# Patient Record
Sex: Male | Born: 1990 | Race: White | Hispanic: No | Marital: Single | Smoking: Former smoker
Health system: Southern US, Community
[De-identification: ages and names within clinical notes are randomized; demographics above are authoritative.]

## PROBLEM LIST (undated history)

## (undated) DIAGNOSIS — K2970 Gastritis, unspecified, without bleeding: Secondary | ICD-10-CM

## (undated) DIAGNOSIS — K922 Gastrointestinal hemorrhage, unspecified: Secondary | ICD-10-CM

## (undated) HISTORY — PX: OTHER SURGICAL HISTORY: SHX169

## (undated) HISTORY — PX: NO PAST SURGERIES: SHX2092

---

## 2015-11-15 ENCOUNTER — Encounter: Payer: Self-pay | Admitting: Emergency Medicine

## 2015-11-15 DIAGNOSIS — K922 Gastrointestinal hemorrhage, unspecified: Secondary | ICD-10-CM | POA: Diagnosis present

## 2015-11-15 DIAGNOSIS — Z87891 Personal history of nicotine dependence: Secondary | ICD-10-CM | POA: Insufficient documentation

## 2015-11-15 DIAGNOSIS — K529 Noninfective gastroenteritis and colitis, unspecified: Principal | ICD-10-CM | POA: Insufficient documentation

## 2015-11-15 DIAGNOSIS — R1032 Left lower quadrant pain: Secondary | ICD-10-CM | POA: Diagnosis not present

## 2015-11-15 LAB — URINALYSIS COMPLETE WITH MICROSCOPIC (ARMC ONLY)
BILIRUBIN URINE: NEGATIVE
Glucose, UA: NEGATIVE mg/dL
Hgb urine dipstick: NEGATIVE
Ketones, ur: NEGATIVE mg/dL
Leukocytes, UA: NEGATIVE
Nitrite: NEGATIVE
PH: 5 (ref 5.0–8.0)
PROTEIN: NEGATIVE mg/dL
Specific Gravity, Urine: 1.03 (ref 1.005–1.030)

## 2015-11-15 LAB — COMPREHENSIVE METABOLIC PANEL
ALK PHOS: 54 U/L (ref 38–126)
ALT: 19 U/L (ref 17–63)
ANION GAP: 8 (ref 5–15)
AST: 22 U/L (ref 15–41)
Albumin: 5.1 g/dL — ABNORMAL HIGH (ref 3.5–5.0)
BILIRUBIN TOTAL: 0.5 mg/dL (ref 0.3–1.2)
BUN: 24 mg/dL — ABNORMAL HIGH (ref 6–20)
CALCIUM: 10 mg/dL (ref 8.9–10.3)
CO2: 29 mmol/L (ref 22–32)
CREATININE: 1.1 mg/dL (ref 0.61–1.24)
Chloride: 100 mmol/L — ABNORMAL LOW (ref 101–111)
Glucose, Bld: 109 mg/dL — ABNORMAL HIGH (ref 65–99)
Potassium: 4.6 mmol/L (ref 3.5–5.1)
Sodium: 137 mmol/L (ref 135–145)
TOTAL PROTEIN: 7.8 g/dL (ref 6.5–8.1)

## 2015-11-15 LAB — CBC
HCT: 43.1 % (ref 40.0–52.0)
HEMOGLOBIN: 14.9 g/dL (ref 13.0–18.0)
MCH: 30.4 pg (ref 26.0–34.0)
MCHC: 34.7 g/dL (ref 32.0–36.0)
MCV: 87.8 fL (ref 80.0–100.0)
PLATELETS: 177 10*3/uL (ref 150–440)
RBC: 4.91 MIL/uL (ref 4.40–5.90)
RDW: 12.1 % (ref 11.5–14.5)
WBC: 12.3 10*3/uL — ABNORMAL HIGH (ref 3.8–10.6)

## 2015-11-15 LAB — LIPASE, BLOOD: Lipase: 23 U/L (ref 11–51)

## 2015-11-15 NOTE — ED Notes (Signed)
Pt states has had abdominal pain pain today, left upper and left lower quadrant with diarrhea today. Pt states has had "blood in the diarrhea". Pt states has vomited once since onset of abd pain. Pt states has had chills.

## 2015-11-16 ENCOUNTER — Observation Stay
Admission: EM | Admit: 2015-11-16 | Discharge: 2015-11-17 | Disposition: A | Discharge status: Home / Self Care | Payer: BLUE CROSS/BLUE SHIELD | Attending: Internal Medicine | Admitting: Internal Medicine

## 2015-11-16 ENCOUNTER — Emergency Department: Payer: BLUE CROSS/BLUE SHIELD

## 2015-11-16 DIAGNOSIS — K922 Gastrointestinal hemorrhage, unspecified: Secondary | ICD-10-CM

## 2015-11-16 DIAGNOSIS — R109 Unspecified abdominal pain: Secondary | ICD-10-CM | POA: Diagnosis present

## 2015-11-16 DIAGNOSIS — K529 Noninfective gastroenteritis and colitis, unspecified: Secondary | ICD-10-CM

## 2015-11-16 HISTORY — DX: Gastritis, unspecified, without bleeding: K29.70

## 2015-11-16 LAB — HEMOGLOBIN AND HEMATOCRIT, BLOOD
HEMATOCRIT: 37.8 % — AB (ref 40.0–52.0)
HEMOGLOBIN: 13.4 g/dL (ref 13.0–18.0)

## 2015-11-16 MED ORDER — SODIUM CHLORIDE 0.9 % IV SOLN
INTRAVENOUS | Status: DC
  Administered 2015-11-16: 10:00:00 via INTRAVENOUS

## 2015-11-16 MED ORDER — MORPHINE SULFATE (PF) 2 MG/ML IV SOLN
2.0000 mg | INTRAVENOUS | Status: DC | PRN
  Administered 2015-11-16 (×4): 2 mg via INTRAVENOUS
  Filled 2015-11-16 (×4): qty 1

## 2015-11-16 MED ORDER — DIATRIZOATE MEGLUMINE & SODIUM 66-10 % PO SOLN
15.0000 mL | ORAL | Status: AC
  Administered 2015-11-16: 30 mL via ORAL
  Administered 2015-11-16: 15 mL via ORAL

## 2015-11-16 MED ORDER — ONDANSETRON HCL 4 MG PO TABS: 4.0000 mg | ORAL_TABLET | Freq: Four times a day (QID) | ORAL | Status: DC | PRN

## 2015-11-16 MED ORDER — ACETAMINOPHEN 650 MG RE SUPP: 650.0000 mg | Freq: Four times a day (QID) | RECTAL | Status: DC | PRN

## 2015-11-16 MED ORDER — OXYCODONE-ACETAMINOPHEN 5-325 MG PO TABS
1.0000 | ORAL_TABLET | Freq: Four times a day (QID) | ORAL | Status: DC | PRN
  Administered 2015-11-16 (×2): 1 via ORAL
  Filled 2015-11-16 (×2): qty 1

## 2015-11-16 MED ORDER — ACETAMINOPHEN 325 MG PO TABS: 650.0000 mg | ORAL_TABLET | Freq: Four times a day (QID) | ORAL | Status: DC | PRN

## 2015-11-16 MED ORDER — METRONIDAZOLE IN NACL 5-0.79 MG/ML-% IV SOLN
500.0000 mg | Freq: Three times a day (TID) | INTRAVENOUS | Status: DC
  Administered 2015-11-16 – 2015-11-17 (×4): 500 mg via INTRAVENOUS
  Filled 2015-11-16 (×6): qty 100

## 2015-11-16 MED ORDER — CIPROFLOXACIN IN D5W 400 MG/200ML IV SOLN
400.0000 mg | Freq: Two times a day (BID) | INTRAVENOUS | Status: DC
  Administered 2015-11-16 – 2015-11-17 (×3): 400 mg via INTRAVENOUS
  Filled 2015-11-16 (×4): qty 200

## 2015-11-16 MED ORDER — IOPAMIDOL (ISOVUE-300) INJECTION 61%
100.0000 mL | Freq: Once | INTRAVENOUS | Status: AC | PRN
  Administered 2015-11-16: 100 mL via INTRAVENOUS

## 2015-11-16 MED ORDER — ONDANSETRON HCL 4 MG/2ML IJ SOLN: 4.0000 mg | Freq: Four times a day (QID) | INTRAMUSCULAR | Status: DC | PRN

## 2015-11-16 MED ORDER — SODIUM CHLORIDE 0.9 % IV BOLUS (SEPSIS)
500.0000 mL | INTRAVENOUS | Status: AC
  Administered 2015-11-16: 500 mL via INTRAVENOUS

## 2015-11-16 NOTE — ED Notes (Signed)
Pt c/o of chills. Pt visibly trembling. Pt's oral temperature is 98.1

## 2015-11-16 NOTE — ED Notes (Signed)
Pt had BM, blood w/ clots present, about 10cc in hat.

## 2015-11-16 NOTE — H&P (Addendum)
PCP:   No primary care provider on file.   Chief Complaint:  Abdominal pain  HPI: This is a 25 year old gentleman who states that last night at approximately 9 PM he went to use the bathroom. While there he had the sudden onset of severe sharp abdominal pain. Pain is worse in the left lower quadrant but it is generalized pain. At its worst the pain is 10/10, currently pain is 10/10. His pain is described as sharp and intermittent. He reports some nausea but no vomiting. He denies any fever or chills. He has had several bowel movements all with blood. He has been having bright red blood per rectum, with some clots. He came to ER. He denies using the incidence. He denies GERD. He's never had a colonoscopy.  Review of Systems:  The patient denies anorexia, fever, weight loss,, vision loss, decreased hearing, hoarseness, chest pain, syncope, dyspnea on exertion, peripheral edema, balance deficits, hemoptysis, abdominal pain, bright red blood per rectum, melena, hematochezia, severe indigestion/heartburn, hematuria, incontinence, genital sores, muscle weakness, suspicious skin lesions, transient blindness, difficulty walking, depression, unusual weight change, abnormal bleeding, enlarged lymph nodes, angioedema, and breast masses.  Past Medical History: Past Medical History  Diagnosis Date  . Gastritis    History reviewed. No pertinent past surgical history.  Medications: Prior to Admission medications   Not on File    Allergies:  No Known Allergies  Social History:  reports that he has quit smoking. He has never used smokeless tobacco. He reports that he does not drink alcohol or use illicit drugs.  Family History: Diabetes mellitus  Physical Exam: Filed Vitals:   11/16/15 0300 11/16/15 0330 11/16/15 0400 11/16/15 0500  BP: 125/77 118/72 126/75 102/65  Pulse: 67 68 76   Temp:      TempSrc:      Resp: 18 17 19 17   Height:      Weight:      SpO2: 99% 96% 99%     General:   Alert and oriented times three, well developed and nourished, no acute distress Eyes: PERRLA, pink conjunctiva, no scleral icterus ENT: Moist oral mucosa, neck supple, no thyromegaly Lungs: clear to ascultation, no wheeze, no crackles, no use of accessory muscles Cardiovascular: regular rate and rhythm, no regurgitation, no gallops, no murmurs. No carotid bruits, no JVD Abdomen: soft, positive BS, Positive tenderness to palpation generalized, non-distended, no organomegaly, not an acute abdomen GU: not examined Neuro: CN II - XII grossly intact, sensation intact Musculoskeletal: strength 5/5 all extremities, no clubbing, cyanosis or edema Skin: no rash, no subcutaneous crepitation, no decubitus Psych: appropriate patient   Labs on Admission:   Recent Labs  11/15/15 2210  NA 137  K 4.6  CL 100*  CO2 29  GLUCOSE 109*  BUN 24*  CREATININE 1.10  CALCIUM 10.0    Recent Labs  11/15/15 2210  AST 22  ALT 19  ALKPHOS 54  BILITOT 0.5  PROT 7.8  ALBUMIN 5.1*    Recent Labs  11/15/15 2210  LIPASE 23    Recent Labs  11/15/15 2210  WBC 12.3*  HGB 14.9  HCT 43.1  MCV 87.8  PLT 177   No results for input(s): CKTOTAL, CKMB, CKMBINDEX, TROPONINI in the last 72 hours. Invalid input(s): POCBNP No results for input(s): DDIMER in the last 72 hours. No results for input(s): HGBA1C in the last 72 hours. No results for input(s): CHOL, HDL, LDLCALC, TRIG, CHOLHDL, LDLDIRECT in the last 72 hours. No results for input(s):  TSH, T4TOTAL, T3FREE, THYROIDAB in the last 72 hours.  Invalid input(s): FREET3 No results for input(s): VITAMINB12, FOLATE, FERRITIN, TIBC, IRON, RETICCTPCT in the last 72 hours.  Micro Results: No results found for this or any previous visit (from the past 240 hour(s)).   Radiological Exams on Admission: Ct Abdomen Pelvis W Contrast  11/16/2015  CLINICAL DATA:  25 year old male with left lower quadrant abdominal pain and melena. EXAM: CT ABDOMEN AND  PELVIS WITH CONTRAST TECHNIQUE: Multidetector CT imaging of the abdomen and pelvis was performed using the standard protocol following bolus administration of intravenous contrast. CONTRAST:  100mL ISOVUE-300 IOPAMIDOL (ISOVUE-300) INJECTION 61% COMPARISON:  None. FINDINGS: The visualized lung bases are clear. No intra-abdominal free air or free fluid. The liver, gallbladder, pancreas, spleen, adrenal glands, kidneys, visualized ureters appear unremarkable. The urinary bladder is collapsed. The prostate and seminal vesicles are grossly unremarkable. There is mild segmental thickening of the distal descending and sigmoid colon most compatible with colitis given clinical history. There is no evidence of bowel obstruction. Normal appendix. The abdominal aorta and IVC appear unremarkable. No portal venous gas identified. There is no adenopathy. The abdominal wall soft tissues appear unremarkable. No acute osseous pathology identified. IMPRESSION: Segmental thickening of the distal descending and sigmoid colon compatible with colitis. Clinical correlation is recommended. No bowel obstruction. Normal appendix. Electronically Signed   By: Elgie CollardArash  Radparvar M.D.   On: 11/16/2015 04:41    Assessment/Plan Present on Admission:  . Colitis Bright red blood per rectum -Admit to MedSurg -Nothing by mouth, IV fluid hydration -Cipro and Flagyl ordered -Morphine when necessary pain -GI consult per AM team in the morning -H&H every 8 hours   Kailie Polus 11/16/2015, 5:56 AM

## 2015-11-16 NOTE — ED Provider Notes (Signed)
[facility] Emergency Department Provider Note  ____________________________________________  Time seen: Approximately 2:30 AM  I have reviewed the triage vital signs and the nursing notes.   HISTORY  Chief Complaint Abdominal Pain and Rectal Bleeding    HPI Brandon Walsh is a 25 y.o. male with no significant past medical history who presents for acute onset of middle and left-sided abdominal pain starting a few hours ago.  He reports that he has had some dark blood in his stool multiple times and has had multiple loose stools since the pain began.  Her had similar symptoms in the past and he rates the pain as severe, sharp and stabbing, but now down to a mild ache.  He has no personal history nor any first-degree relatives with a history of ulcerative colitis or Crohn's disease.  He denies fever but has had subjective chills.  He denies chest pain, shortness of breath.  He did have an episode of vomiting but there is no blood in his emesis.  He has had no trauma recently.  He does not drink alcohol.He has been diagnosed previously with acid reflux and gastritis and he takes, or took in the past, ranitidine, but it did not significantly improve his symptoms.   Past Medical History  Diagnosis Date  . Gastritis     There are no active problems to display for this patient.   History reviewed. No pertinent past surgical history.  No current outpatient prescriptions on file.  Allergies Review of patient's allergies indicates no known allergies.  No family history on file.  Social History Social History  Substance Use Topics  . Smoking status: Former Games developermoker  . Smokeless tobacco: Never Used  . Alcohol Use: No    Review of Systems Constitutional: No fever/chills Eyes: No visual changes. ENT: No sore throat. Cardiovascular: Denies chest pain. Respiratory: Denies shortness of breath. Gastrointestinal: No abdominal pain.  No nausea, no vomiting.  No  diarrhea.  No constipation. Genitourinary: Negative for dysuria. Musculoskeletal: Negative for back pain. Skin: Negative for rash. Neurological: Negative for headaches, focal weakness or numbness.  10-point ROS otherwise negative.  ____________________________________________   PHYSICAL EXAM:  VITAL SIGNS: ED Triage Vitals  Enc Vitals Group     BP 11/15/15 2203 114/69 mmHg     Pulse Rate 11/15/15 2203 78     Resp 11/15/15 2203 16     Temp 11/15/15 2203 98.5 F (36.9 C)     Temp Source 11/15/15 2203 Oral     SpO2 11/15/15 2203 100 %     Weight 11/15/15 2203 190 lb (86.183 kg)     Height 11/15/15 2203 6' (1.829 m)     Head Cir --      Peak Flow --      Pain Score 11/15/15 2203 4     Pain Loc --      Pain Edu? --      Excl. in GC? --     Constitutional: Alert and oriented. Well appearing and in no acute distress. Eyes: Conjunctivae are normal. PERRL. EOMI. Head: Atraumatic. Nose: No congestion/rhinnorhea. Mouth/Throat: Mucous membranes are moist.  Oropharynx non-erythematous. Neck: No stridor.  No meningeal signs.   Cardiovascular: Normal rate, regular rhythm. Good peripheral circulation. Grossly normal heart sounds.   Respiratory: Normal respiratory effort.  No retractions. Lungs CTAB. Gastrointestinal: Soft With tenderness to palpation of his left upper quadrant and left side of his abdomen in general.  No rebound and no guarding.  No tenderness  to palpation of the right lower quadrant specifically with no tenderness at McBurney's point.  Negative Murphy sign. Rectal:  Normal external exam.  Maroon blood on digital exam, strongly heme positive.  No significant stool in rectum. Musculoskeletal: No lower extremity tenderness nor edema. No gross deformities of extremities. Neurologic:  Normal speech and language. No gross focal neurologic deficits are appreciated.  Skin:  Skin is warm, dry and intact. No rash noted. Psychiatric: Mood and affect are normal. Speech and  behavior are normal.  ____________________________________________   LABS (all labs ordered are listed, but only abnormal results are displayed)  Labs Reviewed  COMPREHENSIVE METABOLIC PANEL - Abnormal; Notable for the following:    Chloride 100 (*)    Glucose, Bld 109 (*)    BUN 24 (*)    Albumin 5.1 (*)    All other components within normal limits  CBC - Abnormal; Notable for the following:    WBC 12.3 (*)    All other components within normal limits  URINALYSIS COMPLETEWITH MICROSCOPIC ([facility] ONLY) - Abnormal; Notable for the following:    Color, Urine YELLOW (*)    APPearance CLEAR (*)    Bacteria, UA RARE (*)    Squamous Epithelial / LPF 0-5 (*)    All other components within normal limits  LIPASE, BLOOD   ____________________________________________  EKG  None ____________________________________________  RADIOLOGY   Ct Abdomen Pelvis W Contrast  11/16/2015  CLINICAL DATA:  25 year old male with left lower quadrant abdominal pain and melena. EXAM: CT ABDOMEN AND PELVIS WITH CONTRAST TECHNIQUE: Multidetector CT imaging of the abdomen and pelvis was performed using the standard protocol following bolus administration of intravenous contrast. CONTRAST:  100mL ISOVUE-300 IOPAMIDOL (ISOVUE-300) INJECTION 61% COMPARISON:  None. FINDINGS: The visualized lung bases are clear. No intra-abdominal free air or free fluid. The liver, gallbladder, pancreas, spleen, adrenal glands, kidneys, visualized ureters appear unremarkable. The urinary bladder is collapsed. The prostate and seminal vesicles are grossly unremarkable. There is mild segmental thickening of the distal descending and sigmoid colon most compatible with colitis given clinical history. There is no evidence of bowel obstruction. Normal appendix. The abdominal aorta and IVC appear unremarkable. No portal venous gas identified. There is no adenopathy. The abdominal wall soft tissues appear unremarkable. No acute osseous  pathology identified. IMPRESSION: Segmental thickening of the distal descending and sigmoid colon compatible with colitis. Clinical correlation is recommended. No bowel obstruction. Normal appendix. Electronically Signed   By: Elgie CollardArash  Radparvar M.D.   On: 11/16/2015 04:41    ____________________________________________   PROCEDURES  Procedure(s) performed:   Procedures   ____________________________________________   INITIAL IMPRESSION / ASSESSMENT AND PLAN / ED COURSE  Pertinent labs & imaging results that were available during my care of the patient were reviewed by me and considered in my medical decision making (see chart for details).  Top items in my differential include ulcer, colitis including possibly inflammatory bowel disease, less likely diverticular bleed.  Evaluate with CT scan.  ----------------------------------------- 5:15 AM on 11/16/2015 -----------------------------------------  The patient has gross blood per rectum and extensive colitis on his CT scan.  His pain has improved but is starting to come back.  He has a resting heart rate of 115 currently.  He has no outpatient doctor with whom he can follow-up or who can refer him to GI.  Given the concern for the possibility of IBD, his persistent large volume of the stools, his tachycardia, and his pain, I will admit him for further evaluation and  GI consult.  Patient agrees with this plan and I spoke with the hospitalist who will proceed.  He has a mild leukocytosis but I am going to hold off on antibiotics; in the amount of blood, this may be an anterior hemorrhagic infection and antibiotics would not be recommended.  ____________________________________________  FINAL CLINICAL IMPRESSION(S) / ED DIAGNOSES  Final diagnoses:  Acute GI bleeding  Colitis     MEDICATIONS GIVEN DURING THIS VISIT:  Medications  sodium chloride 0.9 % bolus 500 mL (0 mLs Intravenous Stopped 11/16/15 0343)  diatrizoate  meglumine-sodium (GASTROGRAFIN) 66-10 % solution 15 mL (30 mLs Oral Given 11/16/15 0249)  iopamidol (ISOVUE-300) 61 % injection 100 mL (100 mLs Intravenous Contrast Given 11/16/15 0418)     NEW OUTPATIENT MEDICATIONS STARTED DURING THIS VISIT:  New Prescriptions   No medications on file      Note:  This document was prepared using Dragon voice recognition software and may include unintentional dictation errors.   Loleta Rose, MD 11/16/15 [phone]

## 2015-11-16 NOTE — ED Notes (Signed)
Patient transported to CT 

## 2015-11-16 NOTE — ED Notes (Signed)
Pt c/o of N/V, diarrhea, rectal bleeding mid/left abdominal pain described as stabbing rated as 5 out of 10, and weakness/near syncope when he tried to have a bowel movement at 18:30 on 6/24. Pt reports the blood to be alternating between dark, and bright red with "clumps/tissue"

## 2015-11-16 NOTE — Progress Notes (Signed)
Patient ID: Brandon Walsh, male   DOB: [birth year], 25 y.o.   MRN: [id number] [facility] [place] [facility]   PATIENT NAME: Brandon Walsh    MR#:  [id number]  DATE OF BIRTH:  [birth year]  SUBJECTIVE:  Came in with [facility] Continues with intermittent left abd pain. Last BM  Coffee ground color  REVIEW OF SYSTEMS:   Review of Systems  Constitutional: Negative for fever, chills and weight loss.  HENT: Negative for ear discharge, ear pain and nosebleeds.   Eyes: Negative for blurred vision, pain and discharge.  Respiratory: Negative for sputum production, shortness of breath, wheezing and stridor.   Cardiovascular: Negative for chest pain, palpitations, orthopnea and PND.  Gastrointestinal: Positive for abdominal pain and blood in stool. Negative for nausea, vomiting and diarrhea.  Genitourinary: Negative for urgency and frequency.  Musculoskeletal: Negative for back pain and joint pain.  Neurological: Negative for sensory change, speech change, focal weakness and weakness.  Psychiatric/Behavioral: Negative for depression and hallucinations. The patient is not nervous/anxious.   All other systems reviewed and are negative.  Tolerating Diet:CLD Tolerating PT: not needed  DRUG ALLERGIES:  No Known Allergies  VITALS:  Blood pressure 117/67, pulse 67, temperature 98.1 F (36.7 C), temperature source Oral, resp. rate 18, height 6' (1.829 m), weight 86.183 kg (190 lb), SpO2 97 %.  PHYSICAL EXAMINATION:   Physical Exam  GENERAL:  25 y.o.-year-old patient lying in the bed with no acute distress.  EYES: Pupils equal, round, reactive to light and accommodation. No scleral icterus. Extraocular muscles intact.  HEENT: Head atraumatic, normocephalic. Oropharynx and nasopharynx clear.  NECK:  Supple, no jugular venous distention. No thyroid enlargement, no tenderness.  LUNGS: Normal breath sounds bilaterally, no wheezing, rales, rhonchi. No use of accessory muscles of  respiration.  CARDIOVASCULAR: S1, S2 normal. No murmurs, rubs, or gallops.  ABDOMEN: Soft, nontender, nondistended. Bowel sounds present. No organomegaly or mass.  EXTREMITIES: No cyanosis, clubbing or edema b/l.    NEUROLOGIC: Cranial nerves II through XII are intact. No focal Motor or sensory deficits b/l.   PSYCHIATRIC:  patient is alert and oriented x 3.  SKIN: No obvious rash, lesion, or ulcer.   LABORATORY PANEL:  CBC  Recent Labs Lab 11/15/15 2210 11/16/15 0915  WBC 12.3*  --   HGB 14.9 13.4  HCT 43.1 37.8*  PLT 177  --     Chemistries   Recent Labs Lab 11/15/15 2210  NA 137  K 4.6  CL 100*  CO2 29  GLUCOSE 109*  BUN 24*  CREATININE 1.10  CALCIUM 10.0  AST 22  ALT 19  ALKPHOS 54  BILITOT 0.5   Cardiac Enzymes No results for input(s): TROPONINI in the last 168 hours. RADIOLOGY:  Ct Abdomen Pelvis W Contrast  11/16/2015  CLINICAL DATA:  25 year old male with left lower quadrant abdominal pain and melena. EXAM: CT ABDOMEN AND PELVIS WITH CONTRAST TECHNIQUE: Multidetector CT imaging of the abdomen and pelvis was performed using the standard protocol following bolus administration of intravenous contrast. CONTRAST:  100mL ISOVUE-300 IOPAMIDOL (ISOVUE-300) INJECTION 61% COMPARISON:  None. FINDINGS: The visualized lung bases are clear. No intra-abdominal free air or free fluid. The liver, gallbladder, pancreas, spleen, adrenal glands, kidneys, visualized ureters appear unremarkable. The urinary bladder is collapsed. The prostate and seminal vesicles are grossly unremarkable. There is mild segmental thickening of the distal descending and sigmoid colon most compatible with colitis given clinical history. There is no evidence of bowel obstruction. Normal appendix.  The abdominal aorta and IVC appear unremarkable. No portal venous gas identified. There is no adenopathy. The abdominal wall soft tissues appear unremarkable. No acute osseous pathology identified. IMPRESSION:  Segmental thickening of the distal descending and sigmoid colon compatible with colitis. Clinical correlation is recommended. No bowel obstruction. Normal appendix. Electronically Signed   By: Elgie CollardArash  Radparvar M.D.   On: 11/16/2015 04:41   ASSESSMENT AND PLAN:  25 year old gentleman who states that last night at approximately 9 PM he went to use the bathroom. While there he had the sudden onset of severe sharp abdominal pain. Pain is worse in the left lower quadrant but it is generalized pain. At its worst the pain is 10/10, currently pain is 10/10. His pain is described as sharp and intermittent. He reports some nausea but no vomiting  .Acute  Colitis -presented with Bright red blood per rectum x1 day -CLD, IV fluid hydration -Cipro and Flagyl ordered -Morphine when necessary pain -GI consult if no improvement -H&H stable  * DVT prohlylaxis SCD and TED  Case discussed with Care Management/Social Worker. Management plans discussed with the patient, family and they are in agreement.  CODE STATUS: full  DVT Prophylaxis: SCD  TOTAL TIME TAKING CARE OF THIS PATIENT: 30 minutes.  >50% time spent on counselling and coordination of care  POSSIBLE D/C IN 2 DAYS, DEPENDING ON CLINICAL CONDITION.  Note: This dictation was prepared with Dragon dictation along with smaller phrase technology. Any transcriptional errors that result from this process are unintentional.  Daivik Overley M.D on 11/16/2015 at 2:08 PM  Between 7am to 6pm - Pager - [phone]  After 6pm go to [email or web address] - password [email or web address] [facility]  [facility]  Office  [phone]  CC: Primary care physician; No primary care provider on file.

## 2015-11-16 NOTE — ED Notes (Signed)
Pt given second bottle of contrast

## 2015-11-17 LAB — BASIC METABOLIC PANEL
Anion gap: 5 (ref 5–15)
BUN: 9 mg/dL (ref 6–20)
CHLORIDE: 104 mmol/L (ref 101–111)
CO2: 30 mmol/L (ref 22–32)
Calcium: 8.8 mg/dL — ABNORMAL LOW (ref 8.9–10.3)
Creatinine, Ser: 0.97 mg/dL (ref 0.61–1.24)
GFR calc Af Amer: >60 mL/min (ref >60)
GFR calc non Af Amer: >60 mL/min (ref >60)
Glucose, Bld: 95 mg/dL (ref 65–99)
POTASSIUM: 3.8 mmol/L (ref 3.5–5.1)
Sodium: 139 mmol/L (ref 135–145)

## 2015-11-17 LAB — C DIFFICILE QUICK SCREEN W PCR REFLEX
C DIFFICILE (CDIFF) INTERP: NEGATIVE
C Diff antigen: NEGATIVE
C Diff toxin: NEGATIVE

## 2015-11-17 LAB — CBC
HEMATOCRIT: 37.2 % — AB (ref 40.0–52.0)
Hemoglobin: 13 g/dL (ref 13.0–18.0)
MCH: 31.1 pg (ref 26.0–34.0)
MCHC: 35.1 g/dL (ref 32.0–36.0)
MCV: 88.8 fL (ref 80.0–100.0)
Platelets: 128 10*3/uL — ABNORMAL LOW (ref 150–440)
RBC: 4.19 MIL/uL — ABNORMAL LOW (ref 4.40–5.90)
RDW: 12.3 % (ref 11.5–14.5)
WBC: 8.5 10*3/uL (ref 3.8–10.6)

## 2015-11-17 MED ORDER — OXYCODONE-ACETAMINOPHEN 5-325 MG PO TABS: 1.0000 | ORAL_TABLET | Freq: Four times a day (QID) | ORAL | Status: DC | PRN

## 2015-11-17 MED ORDER — METRONIDAZOLE 500 MG PO TABS
500.0000 mg | ORAL_TABLET | Freq: Three times a day (TID) | ORAL | Status: DC
  Filled 2015-11-17 (×2): qty 1

## 2015-11-17 MED ORDER — CIPROFLOXACIN HCL 500 MG PO TABS: 500.0000 mg | ORAL_TABLET | Freq: Two times a day (BID) | ORAL | Status: DC

## 2015-11-17 MED ORDER — METRONIDAZOLE 500 MG PO TABS: 500.0000 mg | ORAL_TABLET | Freq: Three times a day (TID) | ORAL | Status: DC

## 2015-11-17 NOTE — Progress Notes (Signed)
Patient was discharged home with family. NSL removed with cath intact. Reviewed meds and lost dose given. Patient to sign paperwork at clinic's office before appointment can be made. Allowed time for questions.

## 2015-11-17 NOTE — Discharge Summary (Signed)
[facility] [place] at [facility]   PATIENT NAME: Brandon LevinsJesse Walsh    MR#:  [id number]  DATE OF BIRTH:  03-22-1991  DATE OF ADMISSION:  11/16/2015 ADMITTING PHYSICIAN: Gery Prayebby Crosley, MD  DATE OF DISCHARGE: 11/17/2015  PRIMARY CARE PHYSICIAN: No primary care provider on file.    ADMISSION DIAGNOSIS:  Colitis [K52.9] Acute GI bleeding [K92.2]  DISCHARGE DIAGNOSIS:  Acute colitis distal descending and sigmoid colon  SECONDARY DIAGNOSIS:   Past Medical History  Diagnosis Date  . Gastritis     HOSPITAL COURSE:  25 year old gentleman who states that last night at approximately 9 PM he went to use the bathroom. While there he had the sudden onset of severe sharp abdominal pain. Pain is worse in the left lower quadrant but it is generalized pain.  He reports some nausea but no vomiting  .Acute Colitis -presented with Bright red blood per rectum x1 day -CLD-Doing much better. Advance to full liquid diet.  -Received IV fluid for  hydration -Cipro and Flagyfor 8 more days -Patient's abdominal pain has improved remarkably.  --H&H stable -White count normalized -Dietary instructions given to wife and patient  * DVT prohlylaxis SCD and TED  Overall stable discharge to home Patient advised to follow-up with primary care physician. He needs to establish in the area. He will need GI workup if symptoms recur he voiced understanding.  CONSULTS OBTAINED:     DRUG ALLERGIES:  No Known Allergies  DISCHARGE MEDICATIONS:   Current Discharge Medication List    START taking these medications   Details  ciprofloxacin (CIPRO) 500 MG tablet Take 1 tablet (500 mg total) by mouth 2 (two) times daily. Qty: 16 tablet, Refills: 0    metroNIDAZOLE (FLAGYL) 500 MG tablet Take 1 tablet (500 mg total) by mouth every 8 (eight) hours. Qty: 24 tablet, Refills: 0    oxyCODONE-acetaminophen (PERCOCET/ROXICET) 5-325 MG tablet Take 1 tablet by mouth every 6 (six) hours as  needed for moderate pain. Qty: 30 tablet, Refills: 0        If you experience worsening of your admission symptoms, develop shortness of breath, life threatening emergency, suicidal or homicidal thoughts you must seek medical attention immediately by calling 911 or calling your MD immediately  if symptoms less severe.  You Must read complete instructions/literature along with all the possible adverse reactions/side effects for all the Medicines you take and that have been prescribed to you. Take any new Medicines after you have completely understood and accept all the possible adverse reactions/side effects.   Please note  You were cared for by a hospitalist during your hospital stay. If you have any questions about your discharge medications or the care you received while you were in the hospital after you are discharged, you can call the unit and asked to speak with the hospitalist on call if the hospitalist that took care of you is not available. Once you are discharged, your primary care physician will handle any further medical issues. Please note that NO REFILLS for any discharge medications will be authorized once you are discharged, as it is imperative that you return to your primary care physician (or establish a relationship with a primary care physician if you do not have one) for your aftercare needs so that they can reassess your need for medications and monitor your lab values. Today   SUBJECTIVE   Denies any abdominal pain.  VITAL SIGNS:  Blood pressure 103/50, pulse 67, temperature 97.5 F (36.4 C), temperature  source Oral, resp. rate 18, height 6' (1.829 m), weight 86.183 kg (190 lb), SpO2 97 %.  I/O:   Intake/Output Summary (Last 24 hours) at 11/17/15 1002 Last data filed at 11/17/15 0900  Gross per 24 hour  Intake 3247.5 ml  Output      0 ml  Net 3247.5 ml    PHYSICAL EXAMINATION:  GENERAL:  25 y.o.-year-old patient lying in the bed with no acute distress.  EYES:  Pupils equal, round, reactive to light and accommodation. No scleral icterus. Extraocular muscles intact.  HEENT: Head atraumatic, normocephalic. Oropharynx and nasopharynx clear.  NECK:  Supple, no jugular venous distention. No thyroid enlargement, no tenderness.  LUNGS: Normal breath sounds bilaterally, no wheezing, rales,rhonchi or crepitation. No use of accessory muscles of respiration.  CARDIOVASCULAR: S1, S2 normal. No murmurs, rubs, or gallops.  ABDOMEN: Soft, non-tender, non-distended. Bowel sounds present. No organomegaly or mass.  EXTREMITIES: No pedal edema, cyanosis, or clubbing.  NEUROLOGIC: Cranial nerves II through XII are intact. Muscle strength 5/5 in all extremities. Sensation intact. Gait not checked.  PSYCHIATRIC: The patient is alert and oriented x 3.  SKIN: No obvious rash, lesion, or ulcer.   DATA REVIEW:   CBC   Recent Labs Lab 11/17/15 0410  WBC 8.5  HGB 13.0  HCT 37.2*  PLT 128*    Chemistries   Recent Labs Lab 11/15/15 2210 11/17/15 0410  NA 137 139  K 4.6 3.8  CL 100* 104  CO2 29 30  GLUCOSE 109* 95  BUN 24* 9  CREATININE 1.10 0.97  CALCIUM 10.0 8.8*  AST 22  --   ALT 19  --   ALKPHOS 54  --   BILITOT 0.5  --     Microbiology Results   Recent Results (from the past 240 hour(s))  C difficile quick scan w PCR reflex     Status: None   Collection Time: 11/17/15  2:55 AM  Result Value Ref Range Status   C Diff antigen NEGATIVE NEGATIVE Final   C Diff toxin NEGATIVE NEGATIVE Final   C Diff interpretation Negative for C. difficile  Final    RADIOLOGY:  Ct Abdomen Pelvis W Contrast  11/16/2015  CLINICAL DATA:  25 year old male with left lower quadrant abdominal pain and melena. EXAM: CT ABDOMEN AND PELVIS WITH CONTRAST TECHNIQUE: Multidetector CT imaging of the abdomen and pelvis was performed using the standard protocol following bolus administration of intravenous contrast. CONTRAST:  100mL ISOVUE-300 IOPAMIDOL (ISOVUE-300) INJECTION 61%  COMPARISON:  None. FINDINGS: The visualized lung bases are clear. No intra-abdominal free air or free fluid. The liver, gallbladder, pancreas, spleen, adrenal glands, kidneys, visualized ureters appear unremarkable. The urinary bladder is collapsed. The prostate and seminal vesicles are grossly unremarkable. There is mild segmental thickening of the distal descending and sigmoid colon most compatible with colitis given clinical history. There is no evidence of bowel obstruction. Normal appendix. The abdominal aorta and IVC appear unremarkable. No portal venous gas identified. There is no adenopathy. The abdominal wall soft tissues appear unremarkable. No acute osseous pathology identified. IMPRESSION: Segmental thickening of the distal descending and sigmoid colon compatible with colitis. Clinical correlation is recommended. No bowel obstruction. Normal appendix. Electronically Signed   By: Elgie CollardArash  Radparvar M.D.   On: 11/16/2015 04:41     Management plans discussed with the patient, family and they are in agreement.  CODE STATUS:     Code Status Orders        Start  Ordered   11/16/15 0747  Full code   Continuous     11/16/15 0746    Code Status History    Date Active Date Inactive Code Status Order ID Comments User Context   This patient has a current code status but no historical code status.      TOTAL TIME TAKING CARE OF THIS PATIENT: min40 11/17/2015 utes.    Bethania Schlotzhauer M.D on 11/17/2015 at 10:02 AM  Between 7am to 6pm - Pager - [phone] After 6pm go to [email or web address] - password [email or web address] [facility]  [facility]  Office  [phone]  CC: Primary care physician; No primary care provider on file.

## 2015-11-17 NOTE — Progress Notes (Signed)
Patient ID: Brandon LevinsJesse Walsh, male   DOB: 1990-11-28, 25 y.o.   MRN: [id number]                                          [facility]    Brandon LevinsJesse Walsh was admitted to the Hospital on 11/16/2015 and Discharged  11/17/2015 and should be excused from work/school   for 2 days starting 11/16/2015 , may return to work/school without any restrictions.  Call Enedina FinnerSona Kiyah Demartini MD, [facility]  [phone] with questions.  Sampson Self M.D on 11/17/2015,at 10:06 AM

## 2015-11-17 NOTE — Discharge Instructions (Signed)
Full liquid diet for 2 days and advance to soft diet over the week.

## 2016-03-19 ENCOUNTER — Encounter: Payer: Self-pay | Admitting: *Deleted

## 2016-03-22 ENCOUNTER — Encounter
Admission: RE | Disposition: A | Discharge status: Home / Self Care | Payer: Self-pay | Source: Ambulatory Visit | Attending: Gastroenterology

## 2016-03-22 ENCOUNTER — Ambulatory Visit: Payer: BLUE CROSS/BLUE SHIELD | Admitting: Certified Registered Nurse Anesthetist

## 2016-03-22 ENCOUNTER — Encounter: Payer: Self-pay | Admitting: *Deleted

## 2016-03-22 ENCOUNTER — Ambulatory Visit
Admission: RE | Admit: 2016-03-22 | Discharge: 2016-03-22 | Disposition: A | Discharge status: Home / Self Care | Payer: BLUE CROSS/BLUE SHIELD | Source: Ambulatory Visit | Attending: Gastroenterology | Admitting: Gastroenterology

## 2016-03-22 DIAGNOSIS — Z87891 Personal history of nicotine dependence: Secondary | ICD-10-CM | POA: Insufficient documentation

## 2016-03-22 DIAGNOSIS — K529 Noninfective gastroenteritis and colitis, unspecified: Secondary | ICD-10-CM | POA: Diagnosis present

## 2016-03-22 HISTORY — DX: Gastrointestinal hemorrhage, unspecified: K92.2

## 2016-03-22 HISTORY — PX: COLONOSCOPY: SHX5424

## 2016-03-22 SURGERY — COLONOSCOPY
Anesthesia: General

## 2016-03-22 MED ORDER — SODIUM CHLORIDE 0.9 % IV SOLN: INTRAVENOUS | Status: DC

## 2016-03-22 MED ORDER — SODIUM CHLORIDE 0.9 % IV SOLN
INTRAVENOUS | Status: DC
  Administered 2016-03-22: 14:00:00 via INTRAVENOUS

## 2016-03-22 MED ORDER — LIDOCAINE HCL (PF) 2 % IJ SOLN
INTRAMUSCULAR | Status: DC | PRN
  Administered 2016-03-22: 20 mg

## 2016-03-22 MED ORDER — PHENYLEPHRINE HCL 10 MG/ML IJ SOLN
INTRAMUSCULAR | Status: DC | PRN
  Administered 2016-03-22: 100 ug via INTRAVENOUS

## 2016-03-22 MED ORDER — PROPOFOL 10 MG/ML IV BOLUS
INTRAVENOUS | Status: DC | PRN
  Administered 2016-03-22 (×2): 50 mg via INTRAVENOUS

## 2016-03-22 MED ORDER — PROPOFOL 500 MG/50ML IV EMUL
INTRAVENOUS | Status: DC | PRN
  Administered 2016-03-22: 140 ug/kg/min via INTRAVENOUS

## 2016-03-22 NOTE — H&P (Signed)
Outpatient short stay form Pre-procedure 03/22/2016 2:14 PM [name] U Skulskie MD  Primary Physician: Dr Sula Rumpleharanjit Virk  Reason for visit:  Colonoscopy  History of present illness:  Patient is a 25 year old male presenting today as above. He has a history of an episode of acute left lower quadrant abdominal pain followed by rectal bleeding that occurred several months ago, Midsummer. He was treated with antibiotics after having been seen in the emergency room and observed overnight. The pain resolved slowly over the period of the next 2 weeks. He has not had a repeat episode since then. He's never had a colonoscopy in the past. There is no family history of colon cancer or inflammatory bowel disease. He tolerated his prep well. He takes no aspirin or blood thinning agents.    Current Facility-Administered Medications:  .  0.9 %  sodium chloride infusion, , Intravenous, Continuous, [name] U Skulskie, MD, Last Rate: 20 mL/hr at 03/22/16 1408 .  0.9 %  sodium chloride infusion, , Intravenous, Continuous, [name] U Skulskie, MD  No prescriptions prior to admission.     No Known Allergies   Past Medical History:  Diagnosis Date  . Gastritis   . GI bleed     Review of systems:      Physical Exam    Heart and lungs: Regular rate and rhythm without rub or gallop, lungs are bilaterally clear.   HEENT: Normocephalic atraumatic eyes are anicteric    Other:     Pertinant exam for procedure: Soft nontender nondistended bowel sounds positive normoactive.    Planned proceedures: Colonoscopy and indicated procedures. I have discussed the risks benefits and complications of procedures to include not limited to bleeding, infection, perforation and the risk of sedation and the patient wishes to proceed.    [name] U Skulskie, MD Gastroenterology 03/22/2016  2:14 PM

## 2016-03-22 NOTE — OR Nursing (Signed)
IV Not working,new one 20G restarted in right hand by [employer].

## 2016-03-22 NOTE — Transfer of Care (Signed)
Immediate Anesthesia Transfer of Care Note  Patient: Brandon LevinsJesse Walsh  Procedure(s) Performed: Procedure(s): COLONOSCOPY (N/A)  Patient Location: PACU and Endoscopy Unit  Anesthesia Type:General  Level of Consciousness: sedated  Airway & Oxygen Therapy: Patient Spontanous Breathing  Post-op Assessment: Report given to RN  Post vital signs: stable  Last Vitals:  Vitals:   03/22/16 1356  BP: 122/71  Pulse: 77  Resp: 18  Temp: (!) 35.8 C    Last Pain:  Vitals:   03/22/16 1356  TempSrc: Tympanic         Complications: No apparent anesthesia complications

## 2016-03-22 NOTE — Anesthesia Preprocedure Evaluation (Signed)
Anesthesia Evaluation  Patient identified by MRN, date of birth, ID band Patient awake    Reviewed: Allergy & Precautions, NPO status , Patient's Chart, lab work & pertinent test results  History of Anesthesia Complications Negative for: history of anesthetic complications  Airway Mallampati: II  TM Distance: >3 FB Neck ROM: Full    Dental no notable dental hx.    Pulmonary neg sleep apnea, neg COPD, former smoker,    breath sounds clear to auscultation- rhonchi (-) wheezing      Cardiovascular Exercise Tolerance: Good (-) hypertension(-) CAD and (-) Past MI  Rhythm:Regular Rate:Normal - Systolic murmurs and - Diastolic murmurs    Neuro/Psych negative neurological ROS  negative psych ROS   GI/Hepatic negative GI ROS, Neg liver ROS,   Endo/Other  negative endocrine ROSneg diabetes  Renal/GU negative Renal ROS     Musculoskeletal negative musculoskeletal ROS (+)   Abdominal (+) - obese,   Peds  Hematology negative hematology ROS (+)   Anesthesia Other Findings Past Medical History: No date: Gastritis No date: GI bleed   Reproductive/Obstetrics                             Anesthesia Physical Anesthesia Plan  ASA: II  Anesthesia Plan: General   Post-op Pain Management:    Induction: Intravenous  Airway Management Planned: Natural Airway  Additional Equipment:   Intra-op Plan:   Post-operative Plan:   Informed Consent: I have reviewed the patients History and Physical, chart, labs and discussed the procedure including the risks, benefits and alternatives for the proposed anesthesia with the patient or authorized representative who has indicated his/her understanding and acceptance.   Dental advisory given  Plan Discussed with: CRNA and Anesthesiologist  Anesthesia Plan Comments:         Anesthesia Quick Evaluation

## 2016-03-22 NOTE — Op Note (Addendum)
[facility] Patient Name: Brandon Walsh Procedure Date: 03/22/2016 2:19 PM MRN: [id number] Account #: [id number] Date of Birth: 1990/09/07 Admit Type: Outpatient Age: 25 [facility] ROOM 1 Gender: Male Note Status: Finalized Procedure:            Colonoscopy Indications:          Abnormal CT of the GI tract, Follow-up of colitis Providers:            [name] U. Skulskie, MD Referring MD:         Haynes Kernsharanjit S. Virk (Referring MD) Medicines:            Monitored Anesthesia Care Complications:        No immediate complications. Procedure:            Pre-Anesthesia Assessment:                       - ASA Grade Assessment: I - A normal, healthy patient.                       After obtaining informed consent, the colonoscope was                        passed under direct vision. Throughout the procedure,                        the patient's blood pressure, pulse, and oxygen                        saturations were monitored continuously. The                        Colonoscope was introduced through the anus and                        advanced to the the terminal ileum. The quality of the                        bowel preparation was good except the cecum was poor. Findings:      The colon (entire examined portion) appeared normal.      The terminal ileum appeared normal.      Biopsies for histology were taken with a cold forceps from the right       colon and left colon for evaluation of microscopic colitis.      The digital rectal exam was normal. Impression:           - The entire examined colon is normal.                       - The examined portion of the ileum was normal.                       - Biopsies were taken with a cold forceps from the                        right colon and left colon for evaluation of                        microscopic colitis. Recommendation:       - Telephone GI clinic for  pathology results in 1 week. Procedure  Code(s):    --- Professional ---                       [id number], Colonoscopy, flexible; with biopsy, single or                        multiple Diagnosis Code(s):    --- Professional ---                       K52.9, Noninfective gastroenteritis and colitis,                        unspecified                       R93.3, Abnormal findings on diagnostic imaging of other                        parts of digestive tract CPT copyright 2016 [employer]. All rights reserved. The codes documented in this report are preliminary and upon coder review may  be revised to meet current compliance requirements. [name] U Skulskie, MD 03/22/2016 3:03:42 PM This report has been signed electronically. Number of Addenda: 0 Note Initiated On: 03/22/2016 2:19 PM Scope Withdrawal Time: 0 hours 12 minutes 48 seconds  Total Procedure Duration: 0 hours 24 minutes 53 seconds       [facility]

## 2016-03-23 ENCOUNTER — Encounter: Payer: Self-pay | Admitting: Gastroenterology

## 2016-03-23 NOTE — Anesthesia Postprocedure Evaluation (Signed)
Anesthesia Post Note  Patient: Brandon Walsh  Procedure(s) Performed: Procedure(s) (LRB): COLONOSCOPY (N/A)  Patient location during evaluation: Endoscopy Anesthesia Type: General Level of consciousness: awake and alert and oriented Pain management: pain level controlled Vital Signs Assessment: post-procedure vital signs reviewed and stable Respiratory status: spontaneous breathing, nonlabored ventilation and respiratory function stable Cardiovascular status: blood pressure returned to baseline and stable Postop Assessment: no signs of nausea or vomiting Anesthetic complications: no    Last Vitals:  Vitals:   03/22/16 1525 03/22/16 1535  BP: 106/78 116/71  Pulse: 72 70  Resp: 13 20  Temp:      Last Pain:  Vitals:   03/22/16 1505  TempSrc: Tympanic                 Sayf Kerner

## 2016-03-24 LAB — SURGICAL PATHOLOGY

## 2017-06-03 IMAGING — CT CT ABD-PELV W/ CM
2 of 4 series · 17 of 46 positions shown, 19 images · IV contrast (iopamidol)
Comparison: None.

CLINICAL DATA: 25-year-old male with left lower quadrant abdominal
pain and melena.

EXAM:
CT ABDOMEN AND PELVIS WITH CONTRAST
TECHNIQUE: Multidetector CT imaging of the abdomen and pelvis was performed
using the standard protocol following bolus administration of
intravenous contrast.
CONTRAST:  100mL SVPNT3-OQQ IOPAMIDOL (SVPNT3-OQQ) INJECTION 61%

[Series 2: routine abd pel with · axial · 0.73mm/px · z∈[-594,-140]mm · 14 of 101 slices shown, 16 images]
[im 5/101  soft-tissue]
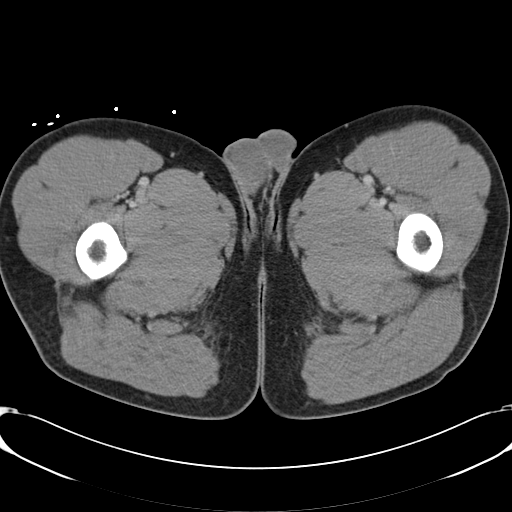
[im 5/101  bone]
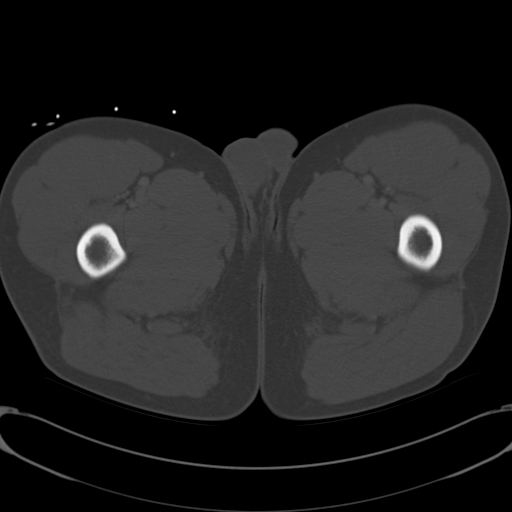
[im 14/101  soft-tissue]
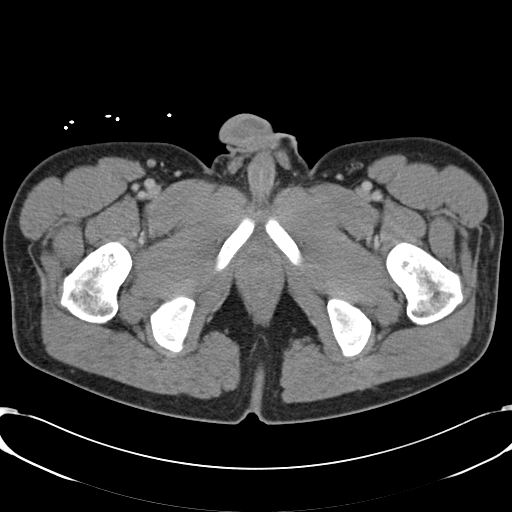
[im 18/101  soft-tissue]
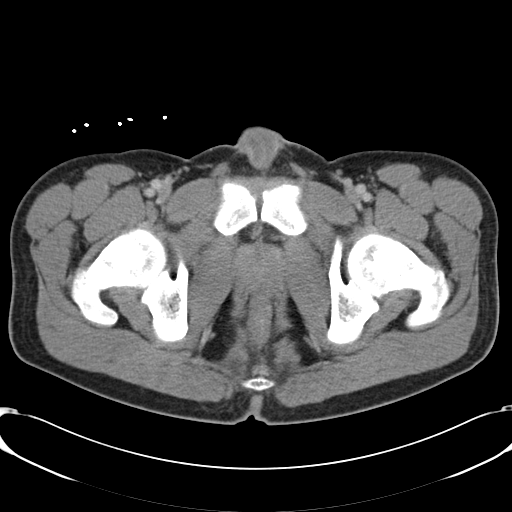
[im 27/101  soft-tissue]
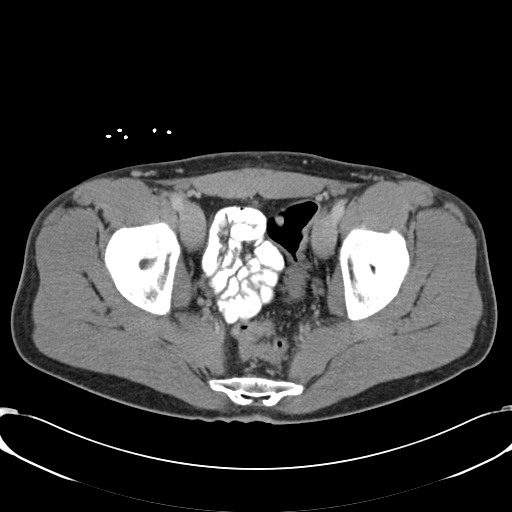
[im 35/101  soft-tissue]
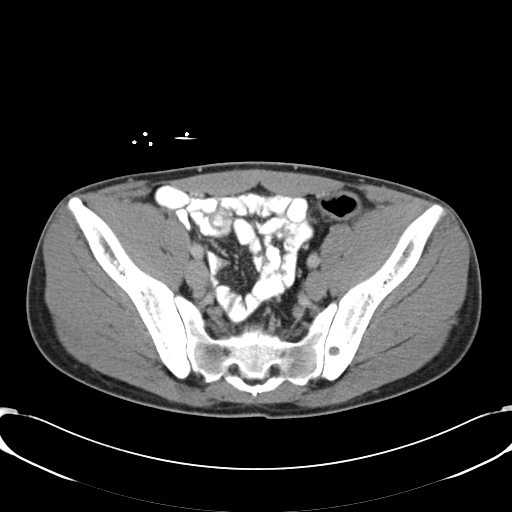
[im 40/101  soft-tissue]
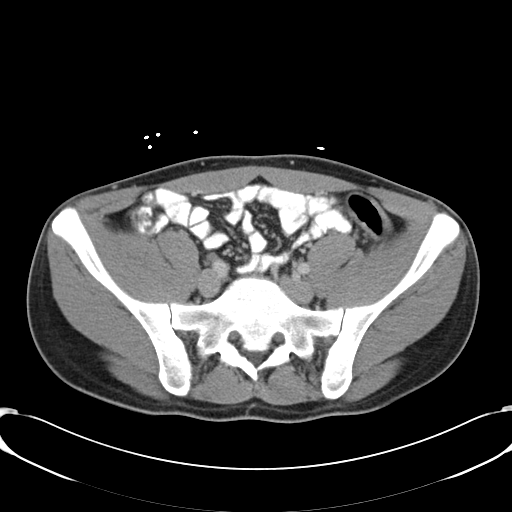
[im 48/101  soft-tissue]
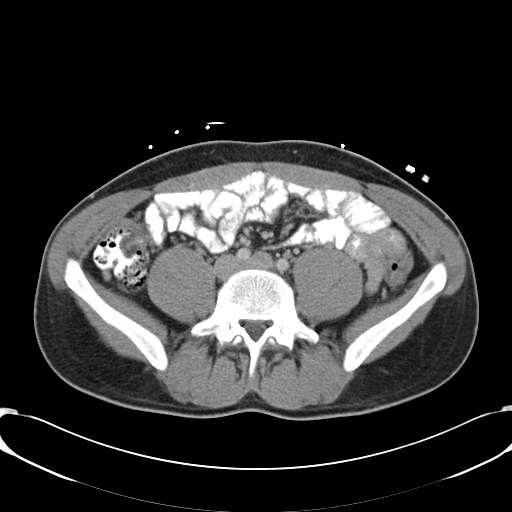
[im 53/101  soft-tissue]
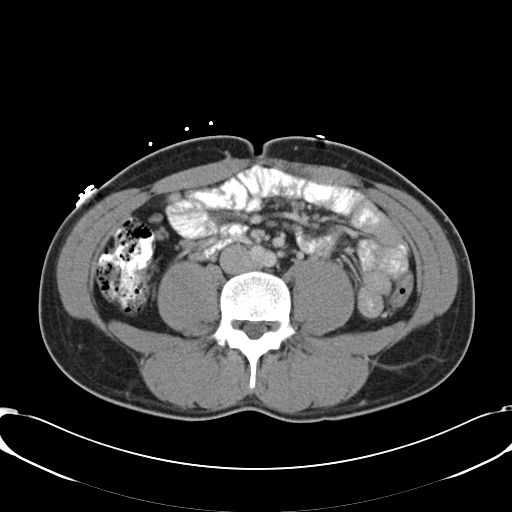
[im 61/101  soft-tissue]
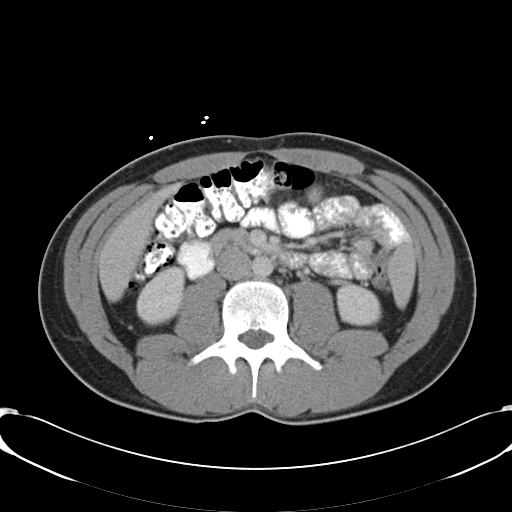
[im 61/101  bone]
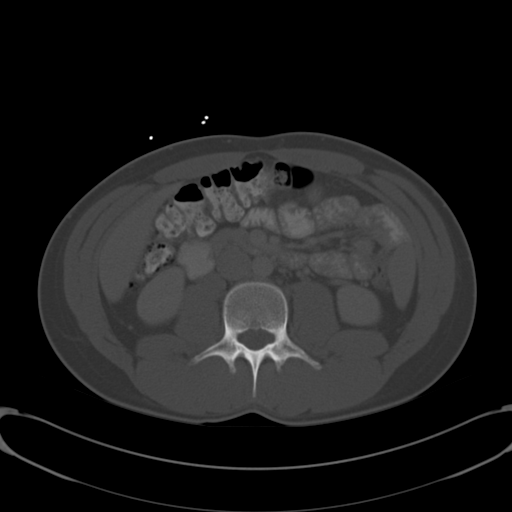
[im 66/101  soft-tissue]
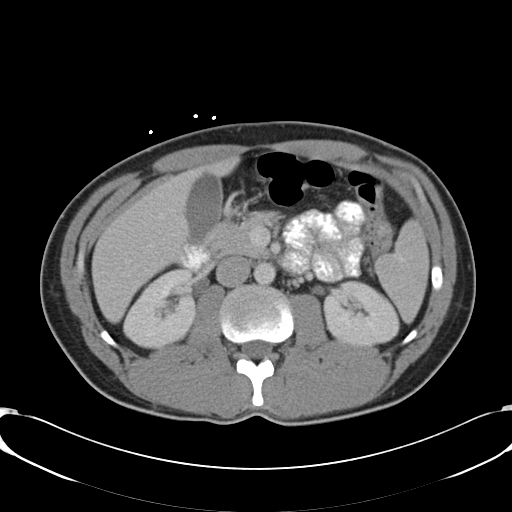
[im 74/101  soft-tissue]
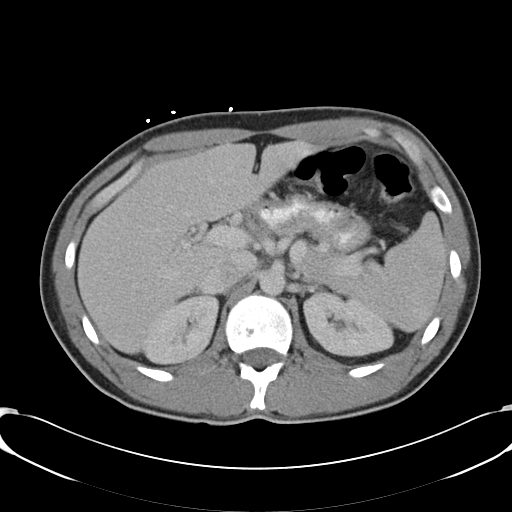
[im 83/101  soft-tissue]
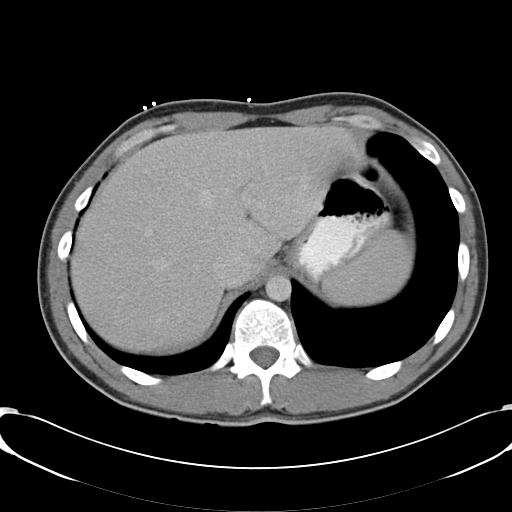
[im 87/101  soft-tissue]
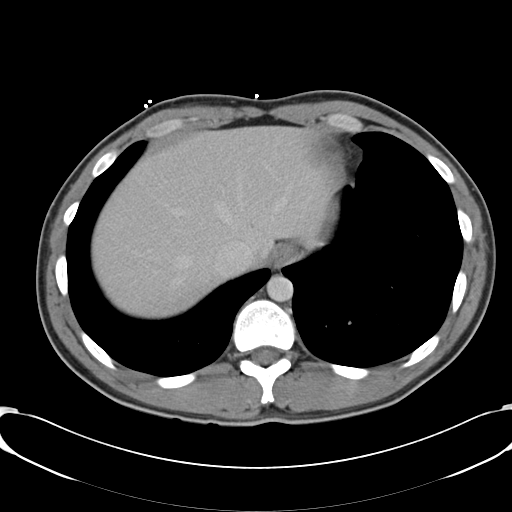
[im 96/101  soft-tissue]
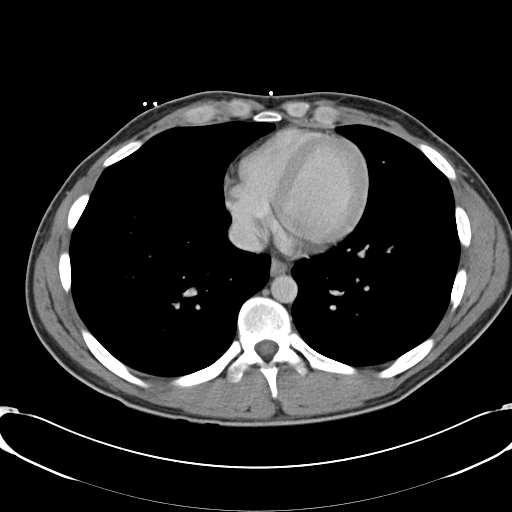

[Series 5: cor routine abd pel with · coronal · 0.82mm/px · 3 of 116 slices shown]
[im 39/116  soft-tissue]
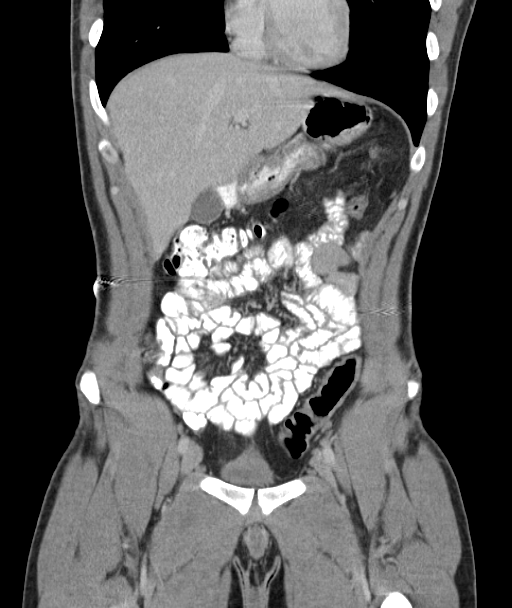
[im 52/116  soft-tissue]
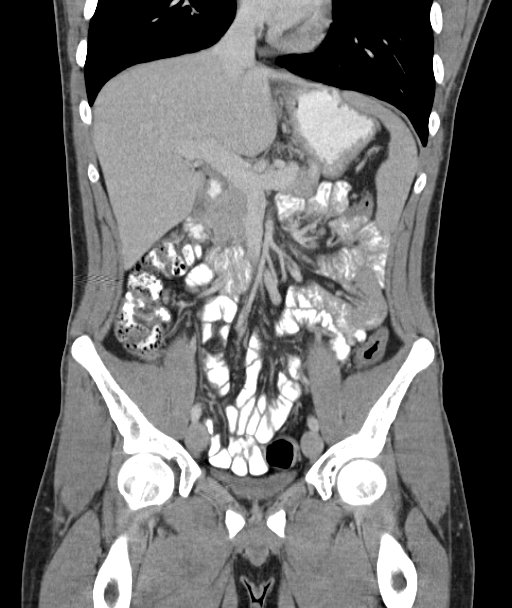
[im 64/116  soft-tissue]
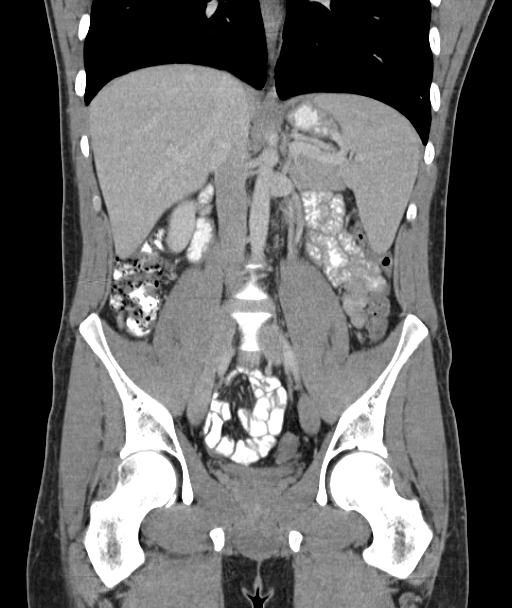

[17 of 46 positions shown; findings below may reference images not displayed]

FINDINGS: The visualized lung bases are clear. No intra-abdominal free air or
free fluid.

The liver, gallbladder, pancreas, spleen, adrenal glands, kidneys,
visualized ureters appear unremarkable. The urinary bladder is
collapsed. The prostate and seminal vesicles are grossly
unremarkable.

There is mild segmental thickening of the distal descending and
sigmoid colon most compatible with colitis given clinical history.
There is no evidence of bowel obstruction. Normal appendix.

The abdominal aorta and IVC appear unremarkable. No portal venous
gas identified. There is no adenopathy. The abdominal wall soft
tissues appear unremarkable. No acute osseous pathology identified.
IMPRESSION: Segmental thickening of the distal descending and sigmoid colon
compatible with colitis. Clinical correlation is recommended. No
bowel obstruction. Normal appendix.

## 2018-05-24 HISTORY — PX: OTHER SURGICAL HISTORY: SHX169

## 2020-04-23 ENCOUNTER — Other Ambulatory Visit: Payer: Self-pay | Admitting: Orthopedic Surgery

## 2020-04-23 DIAGNOSIS — M545 Low back pain, unspecified: Secondary | ICD-10-CM

## 2020-05-09 ENCOUNTER — Ambulatory Visit
Admission: RE | Admit: 2020-05-09 | Discharge: 2020-05-09 | Disposition: A | Discharge status: Home / Self Care | Payer: BLUE CROSS/BLUE SHIELD | Source: Ambulatory Visit | Attending: Orthopedic Surgery | Admitting: Orthopedic Surgery

## 2020-05-09 ENCOUNTER — Other Ambulatory Visit: Payer: Self-pay

## 2020-05-09 DIAGNOSIS — M545 Low back pain, unspecified: Secondary | ICD-10-CM

## 2020-05-10 ENCOUNTER — Other Ambulatory Visit: Payer: Self-pay

## 2020-05-10 ENCOUNTER — Ambulatory Visit
Admission: RE | Admit: 2020-05-10 | Discharge: 2020-05-10 | Disposition: A | Discharge status: Home / Self Care | Payer: BLUE CROSS/BLUE SHIELD | Source: Ambulatory Visit | Attending: Orthopedic Surgery | Admitting: Orthopedic Surgery

## 2020-05-10 DIAGNOSIS — M545 Low back pain, unspecified: Secondary | ICD-10-CM

## 2020-10-21 ENCOUNTER — Encounter: Payer: Self-pay | Admitting: Physical Therapy

## 2020-10-21 ENCOUNTER — Other Ambulatory Visit: Payer: Self-pay

## 2020-10-21 ENCOUNTER — Ambulatory Visit: Payer: BC Managed Care – PPO | Attending: Physical Medicine & Rehabilitation | Admitting: Physical Therapy

## 2020-10-21 DIAGNOSIS — M25552 Pain in left hip: Secondary | ICD-10-CM | POA: Diagnosis present

## 2020-10-21 DIAGNOSIS — M5442 Lumbago with sciatica, left side: Secondary | ICD-10-CM | POA: Diagnosis present

## 2020-10-21 DIAGNOSIS — M545 Low back pain, unspecified: Secondary | ICD-10-CM | POA: Insufficient documentation

## 2020-10-21 NOTE — Therapy (Addendum)
[facility] [address] [place], [place], [address] Phone: [phone]   Fax:  [phone]  Physical Therapy Evaluation  Patient Details  Name: Brandon Walsh MRN: [id number] Date of Birth: [birth year] Referring Provider (PT): [name], MD   Encounter Date: 10/21/2020   PT End of Session - 10/21/20 1841    Visit Number 1    Number of Visits 13    Date for PT Re-Evaluation 12/02/20    Authorization Type BCBS Comm Pro: 30 visit scalendar year combined (PT/OT/chiro)    Authorization Time Period 10/21/20-12/02/20    Authorization - Number of Visits 1   Needs to be verified   Progress Note Due on Visit 30    PT Start Time 1605    PT Stop Time 1655    PT Time Calculation (min) 50 min    Activity Tolerance Patient tolerated treatment well;No increased pain    Behavior During Therapy WFL for tasks assessed/performed           Past Medical History:  Diagnosis Date  . Gastritis   . GI bleed     Past Surgical History:  Procedure Laterality Date  . COLONOSCOPY N/A 03/22/2016   Procedure: COLONOSCOPY;  Surgeon: Christena Deem, MD;  Location: [facility] ENDOSCOPY;  Service: Endoscopy;  Laterality: N/A;  . Foreign body removed 3rd distal phalanx Right 2020  . NO PAST SURGERIES    . wisdom teeth removal      There were no vitals filed for this visit.    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 Patient reports that symptoms were mild at initial onset. He reports that symptoms have worsened. He reports that it got to point that it was hard to bend over and  tie his shoe. He reports going to [place] in 01/2020 and was being treated for tendonitis; failed prednisone taper. Pt reports flare up in late 2021 during which he had difficulty bending over to tie his shoes. He reports his leg "gave out completely" when climbing onto service truck. Pt reports several visits to orthopedist as well as one episode of physical therapy at [facility] office (PT in Nov-Dec 2021) and was treated for piriformis syndrome. Pt was informed about disc bulge in L5-S1 region with compression of nerve root. Pt was given option of back surgery or ESI - pt elected to undergo ESI. Hx of ESI x 3 in lumbar spine (he states that 3rd ESI did reduce his pain drastically). Pt reports significant difficulty with L knee extension. Pt reports no numbness or paresthesias; he reports consant throb affecting LLE. He states it recently hasn't been as sharp. Pt denies recent night pain. Pt denies changes in bathroom habits. Pt reports he did have LLE weakness that has since improved. Aggravated by: sciatic nerve tensioner, bending, tying shoes. Alleviated by: lying down, most recent ESI. 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    Limitations Other (comment)    Diagnostic tests MRI L-spine: (per radiologist report) Disc desiccation with mild intervertebral disc  space narrowing. Left subarticular disc protrusion with mild inferior  migration. Disc material impinges upon and displaces the descending  left S1 nerve root as it courses through the left lateral recess    Currently in Pain? Yes    Pain Score 1     Pain Location Buttocks    Pain Orientation Left    Pain Descriptors / Indicators Sharp    Pain Type Acute pain;Chronic pain   Intermittent pain since November with acute flare-ups   Pain Onset More than a month ago    Pain Frequency Intermittent              [facility] PT Assessment - 10/21/20 0001      Assessment   Medical Diagnosis Low  back pain, L sciatica    Referring Provider (PT) [name], MD    Onset Date/Surgical Date 01/23/20    Prior Therapy PT in Nov-Dec 2021      Balance Screen   Has the patient fallen in the past 6 months No    Has the patient had a decrease in activity level because of a fear of falling?  No    Is the patient reluctant to leave their home because of a fear of falling?  No      Home Environment   Living Environment Private residence      Prior Function   Level of Independence Independent                                          OBJECTIVE  Mental Status Patient is oriented to person, place and time.  Recent memory is intact.  Remote memory is intact.  Attention span and concentration are intact.  Expressive speech is intact.  Patient's fund of knowledge is within normal limits for educational level.  SENSATION: Grossly intact to light touch bilateral LEs as determined by testing dermatomes L2-S2 Proprioception and hot/cold testing deferred on this date  NEURO SCREEN: Reflexes are normal at L3 and S2 Clonus: negative Hoffman's sign: negative  Posture Lumbar lordosis: WNL Lumbar lateral shift: negative Lower crossed syndrome (tight hip flexors and erector spinae; weak gluts and abs): negative   Palpation Tenderness to palpation along L piriformis (1+) and L longissimus lumborum L4-S1 (1+)    Strength (out of 5) R/L 4+/5 Hip flexion 5/5 Hip ER 5/5 Hip IR 5/5 Hip adduction 5/5 Knee extension 5/5 Knee flexion 5/5 Ankle dorsiflexion 5/4+ Hallux extension   *Indicates pain   AROM (degrees) R/L (all movements include overpressure unless otherwise stated) Lumbar forward flexion: to patella* (L gluteal pain) Lumbar extension (30): 100% Lumbar lateral flexion (25): R: 100%  L: 100% Thoracic and Lumbar rotation (30 degrees):  R: 100% L: 100% Hip IR (0-45): R: 45, L: 45 Hip ER (0-45): R: 45, L: 45 Hip Flexion (0-125): R: WNL, L WNL  *Indicates  pain   Repeated Movements Repeated extension in lying: produce, abolished (while in lying)   Muscle Length Hamstrings: R: 35 degrees L: 45 degrees  Ely: R: normal, L: normal   Passive Accessory Intervertebral Motion (PAIVM) Pt denies reproduction of back pain with CPA L1-L5 and UPA bilaterally L1-L5. Hypomobile with L3-L5 CPA, bilateral UPA    SPECIAL TESTS SLUMP: R negative, L negative SLR: R negative, L negative PACE sign (-)    Functional Tasks  Squatting: Full depth squat with upright posture, excessive  forward trunk lean and R hip in increased ER   Objective measurements completed on examination: See above findings.    ASSESSMENT Clinical Impression: Pt is a pleasant 30 year old male referred for low back pain with L-sided sciatica. Clinical presentation consistent with lumbar spine referral pattern likely associated with discogenic etiology. Provisional MDT mechanical diagnosis of posterior derangement. Low index of suspicion for peripheral nerve entrapment. PT examination reveals deficits in L-spine AROM, posterior chain soft tissue mobility, hip flexor strength. Pt presents with deficits in strength, mobility, range of motion, and pain. Pt's prognosis is enhanced by young age, active lifestyle, good social support; it is diminished by ongoing intermittent/recurrent symptoms since 01/2020.  Pt will benefit from skilled PT services to address deficits and return to pain-free function at home and work.      Treatment performed  Therapeutic Activity - patient education and activities to improve patient function, HEP education/establishment Repeated extension in lying; x10, 1 sec hold (prone) Supine active hamstring stretch; 2x10, 1 sec hold  Patient education (see below)      PT Education - 10/21/20 1741    Education Details Patient education on current condition, role of PT, role of direction of preference and repeated movement. Discussed with patient activity  modification for completing ADLs/self-care (modification with bending and tying shoes). Reviewed HEP and PT POC    Person(s) Educated Patient    Methods Explanation;Demonstration    Comprehension Returned demonstration;Verbalized understanding           PT Short Term Goals - 10/21/20 1810      PT SHORT TERM GOAL #1   Title Patient will be indepedent and 100% compliant with established HEP and activity modification as needed to augment PT intervention and prevent flare-up of patient condition.    Baseline No established home program, verbal cueing and demo for HEP performance    Time 2    Period Weeks    Status New    Target Date 11/04/20      PT SHORT TERM GOAL #2   Title Patient will have full thoracolumbar AROM without reproduction of pain as needed for reaching items on ground, household chores, bending.    Baseline Decreased lumbar flexion AROM, to patella at baseline    Time 3    Period Weeks    Status New    Target Date 11/11/20      PT SHORT TERM GOAL #3   Title Patient will perform modified hip hinge with dowel demonstrating improved ability to complete bending/hinging task with neutral spine and without reproduction of pain indicative of improved tolerance of flexion-based tasks    Baseline Pain with bending in standing    Time 3    Period Weeks    Status New    Target Date 11/11/20           PT Long Term Goals - 10/21/20 2107      PT LONG TERM GOAL #1   Title Patient will demonstrate improved function as evidenced by a score of 77 on FOTO measure for full participation in activities at home and in the community.    Baseline FOTO 70    Time 6    Period Weeks    Status New    Target Date 12/03/20      PT LONG TERM GOAL #2   Title Patient will have no reproduction of pain with sciatic tensioner as needed for extending lower limb to operate foot controls on heavy machinery and complete self-care ADLs  e.g. tying and donning footwear    Baseline Inability to  assume SLUMP position/sciatic tensioner    Time 6    Period Weeks    Status New    Target Date 12/03/20      PT LONG TERM GOAL #3   Title Patient will have hip and core strength (per Sahrmann scale) 5/5 indicative of excellent lower quarter strength required for performing heavy physical labor and operating heavy equipment at work    Baseline Hip flexor strength decreased, decreased capacity for loading core/gluteal mm    Time 6    Period Weeks    Status New    Target Date 12/03/20              Plan - 10/21/20 1806    Clinical Impression Statement Clinical Impression: Pt is a pleasant 30 year old male referred for low back pain with L-sided sciatica. Clinical presentation consistent with lumbar spine referral pattern likely associated with discogenic etiology. Provisional MDT mechanical diagnosis of posterior derangement. Low index of suspicion for peripheral nerve entrapment. PT examination reveals deficits in L-spine AROM, posterior chain soft tissue mobility, hip flexor strength. Pt presents with deficits in strength, mobility, range of motion, and pain. Pt's prognosis is enhanced by young age, active lifestyle, good social support; it is diminished by ongoing intermittent/recurrent symptoms since 01/2020.  Pt will benefit from skilled PT services to address deficits and return to pain-free function at home and work.    Personal Factors and Comorbidities Profession;Time since onset of injury/illness/exacerbation    Examination-Activity Limitations Bend;Dressing    Examination-Participation Restrictions Occupation    Stability/Clinical Decision Making Evolving/Moderate complexity    Clinical Decision Making Moderate    Rehab Potential Excellent    PT Frequency 2x / week    PT Duration 6 weeks    PT Treatment/Interventions Therapeutic activities;Therapeutic exercise;Manual techniques;Neuromuscular re-education;Dry needling;Spinal Manipulations;Joint Manipulations;Patient/family  education    PT Next Visit Plan Follow up on response to HEP, review of expectations following performance of repeated movement. Progress with lumbar spine and hip mobility, graded movement. Manual therapy as needed for sypmtom modulation.    PT Home Exercise Plan HEP Access Code: [email or web address]  URL: [email or web address]  Date: 10/21/2020  Prepared by: Consuela Mimes    Exercises  Prone Press Up - 5-6 x daily - 7 x weekly - 1 sets - 10 reps - 1sec hold  Supine Hamstring Stretch - 2 x daily - 7 x weekly - 2 sets - 10 reps - 1sec hold    Consulted and Agree with Plan of Care Patient            Patient will benefit from skilled therapeutic intervention in order to improve the following deficits and impairments:  Hypomobility,Pain,Improper body mechanics  Visit Diagnosis: Left-sided low back pain with left-sided sciatica, unspecified chronicity  Left hip pain     Problem List Patient Active Problem List   Diagnosis Date Noted  . Colitis 11/16/2015  . Abdominal pain 11/16/2015   Consuela Mimes, PT, DPT [phone] Gertie Exon 10/21/2020, 6:43 PM  [facility] [address] [place], [place], [address] Phone: [phone]   Fax:  [phone]  Name: Brandon Walsh MRN: [id number] Date of Birth: 1990-07-23

## 2020-10-22 NOTE — Addendum Note (Signed)
Addended by: Consuela Mimes T on: 10/22/2020 09:15 AM   Modules accepted: Orders

## 2020-10-23 ENCOUNTER — Encounter: Payer: BC Managed Care – PPO | Admitting: Physical Therapy

## 2020-10-27 ENCOUNTER — Encounter: Payer: BC Managed Care – PPO | Admitting: Physical Therapy

## 2020-10-28 ENCOUNTER — Other Ambulatory Visit: Payer: Self-pay

## 2020-10-28 ENCOUNTER — Ambulatory Visit: Payer: BC Managed Care – PPO | Attending: Physical Medicine & Rehabilitation | Admitting: Physical Therapy

## 2020-10-28 ENCOUNTER — Encounter: Payer: Self-pay | Admitting: Physical Therapy

## 2020-10-28 ENCOUNTER — Encounter: Payer: BC Managed Care – PPO | Admitting: Physical Therapy

## 2020-10-28 DIAGNOSIS — M25552 Pain in left hip: Secondary | ICD-10-CM | POA: Diagnosis present

## 2020-10-28 DIAGNOSIS — M5442 Lumbago with sciatica, left side: Secondary | ICD-10-CM

## 2020-10-28 NOTE — Therapy (Signed)
[facility] [address] [place], [place], [address] Phone: [phone]   Fax:  [phone]  Physical Therapy Treatment  Patient Details  Name: Brandon LevinsJesse Walsh MRN: [id number] Date of Birth: 07-18-1990 Referring Provider (PT): Brandon BirkJennifer I Morales, MD   Encounter Date: 10/28/2020   PT End of Session - 10/28/20 0740    Visit Number 2    Number of Visits 13    Date for PT Re-Evaluation 12/02/20    Authorization Type BCBS Comm Pro: 30 visit scalendar year combined (PT/OT/chiro)    Authorization Time Period 10/21/20-12/02/20    Authorization - Number of Visits 2   Needs to be verified   Progress Note Due on Visit 10    PT Start Time 0733    PT Stop Time 0817    PT Time Calculation (min) 44 min    Activity Tolerance Patient tolerated treatment well;No increased pain    Behavior During Therapy WFL for tasks assessed/performed           Past Medical History:  Diagnosis Date  . Gastritis   . GI bleed     Past Surgical History:  Procedure Laterality Date  . COLONOSCOPY N/A 03/22/2016   Procedure: COLONOSCOPY;  Surgeon: [name] U Skulskie, MD;  [facility] END[facility];  Service: Endoscopy;  Laterality: N/A;  . Foreign body removed 3rd distal phalanx Right 2020  . NO PAST SURGERIES    . wisdom teeth removal      There were no vitals filed for this visit.   Subjective Assessment - 10/28/20 0733    Subjective Patient reports pain only when bending. Patient reports that exercises do help and he feels that his mobility is better after performing his exercises. He reports doing well at work and his workplace allows him to take his time.    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 Patient reports that symptoms were  mild at initial onset. He reports that symptoms have worsened. He reports that it got to point that it was hard to bend over and tie his shoe. He reports going to [place] in 01/2020 and was being treated for tendonitis; failed prednisone taper. Pt reports flare up in late 2021 during which he had difficulty bending over to tie his shoes. He reports his leg "gave out completely" when climbing onto service truck. Pt reports several visits to orthopedist as well as one [facility] of [facility] (PT in Nov-Dec 2021) and was treated for piriformis syndrome. Pt was informed about disc bulge in L5-S1 region with compression of nerve root. Pt was given option of back surgery or ESI - pt elected to undergo ESI. Hx of ESI x 3 in lumbar spine (he states that 3rd ESI did reduce his pain drastically). Pt reports significant difficulty with L knee extension. Pt reports no numbness or paresthesias; he reports consant throb affecting LLE. He states it recently hasn't been as sharp. Pt denies recent night pain. Pt denies changes in bathroom habits. Pt reports he did have LLE weakness that has since improved. Aggravated by: sciatic nerve tensioner, bending, tying shoes. Alleviated by: lying down, most recent ESI. 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  Limitations Other (comment)    Diagnostic tests MRI L-spine: (per radiologist report) Disc desiccation with mild intervertebral disc space narrowing. Left subarticular disc protrusion with mild inferior  migration. Disc material impinges upon and displaces the descending  left S1 nerve root as it courses through the left lateral recess    Currently in Pain? No/denies    Pain Onset More than a month ago           Objective Findings Lumbar AROM Flexion 50% p! L gluteal SB R/L 100% Rotate R/L 100%    Treatment performed Therapeutic Activity - patient education and activities to improve  patient function, HEP education/establishment Repeated extension in lying; 2x10, 1 sec hold (prone) Supine active hamstring stretch; 2x10, 1 sec hold  Manual therapy - for symptom modulation, soft tissue mobility to improve lumbopelvic/trunk ROM STM and DTM performed to L lower lumbar paraspinals, L gluteal mm CPA and L UPA L3-S1 gr I-II for pain control/symptom modulation  Therapeutic exercise - for soft tissue mobility and flexibility, lumbar spine ROM Lower trunk rotation with QL bias (figure-4 position); x10 each LE Piriformis stretch; 3x30 seconds L side today   Trigger Point Dry Needling (TDN), unbilled Education performed with patient regarding potential benefit of TDN. Reviewed precautions and risks with patient. Reviewed special precautions/risks over lung fields which include pneumothorax. Reviewed signs and symptoms of pneumothorax and advised pt to go to ER immediately if these symptoms develop advise them of dry needling treatment. Extensive time spent with pt to ensure full understanding of TDN risks. Pt provided verbal consent to treatment. TDN performed to  with 0.30 x 60 single needle placements with local twitch response (LTR) to L L5 multifidus, 0.30 x 75 single needle with LTR at L piriformis Pistoning technique utilized at L piriformis. Improved pain-free motion following intervention.        PT Short Term Goals - 10/21/20 1810      PT SHORT TERM GOAL #1   Title Patient will be indepedent and 100% compliant with established HEP and activity modification as needed to augment PT intervention and prevent flare-up of patient condition.    Baseline No established home program, verbal cueing and demo for HEP performance    Time 2    Period Weeks    Status New    Target Date 11/04/20      PT SHORT TERM GOAL #2   Title Patient will have full thoracolumbar AROM without reproduction of pain as needed for reaching items on ground, household chores, bending.    Baseline  Decreased lumbar flexion AROM, to patella at baseline    Time 3    Period Weeks    Status New    Target Date 11/11/20      PT SHORT TERM GOAL #3   Title Patient will perform modified hip hinge with dowel demonstrating improved ability to complete bending/hinging task with neutral spine and without reproduction of pain indicative of improved tolerance of flexion-based tasks    Baseline Pain with bending in standing    Time 3    Period Weeks    Status New    Target Date 11/11/20             PT Long Term Goals - 10/21/20 2107      PT LONG TERM GOAL #1   Title Patient will demonstrate improved function as evidenced by a score of 77 on FOTO measure for full participation in activities at home and in the community.  Baseline FOTO 70    Time 6    Period Weeks    Status New    Target Date 12/03/20      PT LONG TERM GOAL #2   Title Patient will have no reproduction of pain with sciatic tensioner as needed for extending lower limb to operate foot controls on heavy machinery and complete self-care ADLs e.g. tying and donning footwear    Baseline Inability to assume SLUMP position/sciatic tensioner    Time 6    Period Weeks    Status New    Target Date 12/03/20      PT LONG TERM GOAL #3   Title Patient will have hip and core strength (per Sahrmann scale) 5/5 indicative of excellent lower quarter strength required for performing heavy physical labor and operating heavy equipment at work    Baseline Hip flexor strength decreased, decreased capacity for loading core/gluteal mm    Time 6    Period Weeks    Status New    Target Date 12/03/20                 Plan - 10/29/20 0708    Clinical Impression Statement Patient has improved ability to access lumbar flexion AROM following repeated extension in lying. Patient does have ongoing difficulty with active L knee extension in position of hip flexion (sciatic nerve tensioner). Patient has good tolerance to dry needling today and  sound twitch response obtained at L piriformis with mild referral pattern reproduced; mild concordant sign with treatment of L L5 multifidus. Patient needs continued work on posterior chain soft tissue mobility/perineural mobility and lumbar spine ROM as well as strategies to reduce referred pain. Patient will benefit from continued skilled therapeutic intervention to address deficits in ROM, pain, and ability to perform work-related activitites for best return to prior level of function.    Personal Factors and Comorbidities Profession;Time since onset of injury/illness/exacerbation    Examination-Activity Limitations Bend;Dressing    Examination-Participation Restrictions Occupation    Stability/Clinical Decision Making Evolving/Moderate complexity    Rehab Potential Excellent    PT Frequency 2x / week    PT Duration 6 weeks    PT Treatment/Interventions Therapeutic activities;Therapeutic exercise;Manual techniques;Neuromuscular re-education;Dry needling;Spinal Manipulations;Joint Manipulations;Patient/family education    PT Next Visit Plan Extension principle management with progression of force per MDT guidelines as needed. DN at future sessions to address TrP referral pattern as needed. Graded movement and neurodynamics.    PT Home Exercise Plan HEP Access Code: [email or web address]  URL: [email or web address]  Date: 10/21/2020  Prepared by: Consuela Mimes    Exercises  Prone Press Up - 5-6 x daily - 7 x weekly - 1 sets - 10 reps - 1sec hold  Supine Hamstring Stretch - 2 x daily - 7 x weekly - 2 sets - 10 reps - 1sec hold    Consulted and Agree with Plan of Care Patient           Patient will benefit from skilled therapeutic intervention in order to improve the following deficits and impairments:  Hypomobility,Pain,Improper body mechanics  Visit Diagnosis: Left-sided low back pain with left-sided sciatica, unspecified chronicity  Left hip pain     Problem List Patient Active  Problem List   Diagnosis Date Noted  . Colitis 11/16/2015  . Abdominal pain 11/16/2015   Consuela Mimes, PT, DPT [phone] Gertie Exon 10/29/2020, 7:21 AM  [facility] [address] [place], [place], [address] Phone: [phone]  Fax:  [phone]  Name: Mickel Schreur MRN: [id number] Date of Birth: 02/12/1991

## 2020-10-29 ENCOUNTER — Encounter: Payer: BC Managed Care – PPO | Admitting: Physical Therapy

## 2020-10-30 ENCOUNTER — Ambulatory Visit: Payer: BC Managed Care – PPO | Admitting: Physical Therapy

## 2020-10-30 ENCOUNTER — Encounter: Payer: BC Managed Care – PPO | Admitting: Physical Therapy

## 2020-10-30 ENCOUNTER — Other Ambulatory Visit: Payer: Self-pay

## 2020-10-30 DIAGNOSIS — M25552 Pain in left hip: Secondary | ICD-10-CM

## 2020-10-30 DIAGNOSIS — M5442 Lumbago with sciatica, left side: Secondary | ICD-10-CM

## 2020-10-30 NOTE — Therapy (Signed)
[facility] [address] [place], [place], [address] Phone: [phone]   Fax:  [phone]  Physical Therapy Treatment  Patient Details  Name: Brandon Walsh MRN: [id number] Date of Birth: June 28, 1990 Referring Provider (PT): [name], MD   Encounter Date: 10/30/2020   PT End of Session - 10/30/20 0800     Visit Number 3    Number of Visits 13    Date for PT Re-Evaluation 12/02/20    Authorization Type BCBS Comm Pro: 30 visit scalendar year combined (PT/OT/chiro)    Authorization Time Period 10/21/20-12/02/20    Authorization - Number of Visits 3   Needs to be verified   Progress Note Due on Visit 10    PT Start Time 0733    PT Stop Time 0815    PT Time Calculation (min) 42 min    Activity Tolerance Patient tolerated treatment well;No increased pain    Behavior During Therapy [facility] for tasks assessed/performed             Past Medical History:  Diagnosis Date   Gastritis    GI bleed     Past Surgical History:  Procedure Laterality Date   COLONOSCOPY N/A 03/22/2016   Procedure: COLONOSCOPY;  Surgeon: Christena Deem, MD;  Location: [facility] ENDOSCOPY;  Service: Endoscopy;  Laterality: N/A;   Foreign body removed 3rd distal phalanx Right 2020   NO PAST SURGERIES     wisdom teeth removal      There were no vitals filed for this visit.   Subjective Assessment - 10/30/20 0735     Subjective Patient reports no pain at arrival. He reports he lunges to tie his shoes now versus bending over. He states he feels okay with bending this AM. Patient reports compliance with HEP.    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 Patient reports that symptoms were mild at initial onset. He reports that symptoms  have worsened. He reports that it got to point that it was hard to bend over and tie his shoe. He reports going to [place] in 01/2020 and was being treated for tendonitis; failed prednisone taper. Pt reports flare up in late 2021 during which he had difficulty bending over to tie his shoes. He reports his leg "gave out completely" when climbing onto service truck. Pt reports several visits to orthopedist as well as one episode of physical therapy at [facility] office (PT in Nov-Dec 2021) and was treated for piriformis syndrome. Pt was informed about disc bulge in L5-S1 region with compression of nerve root. Pt was given option of back surgery or ESI - pt elected to undergo ESI. Hx of ESI x 3 in lumbar spine (he states that 3rd ESI did reduce his pain drastically). Pt reports significant difficulty with L knee extension. Pt reports no numbness or paresthesias; he reports consant throb affecting LLE. He states it recently hasn't been as sharp. Pt denies recent night pain. Pt denies changes in bathroom habits. Pt reports he did have LLE weakness that has since improved. Aggravated by: sciatic nerve tensioner, bending, tying shoes. Alleviated by: lying down, most recent ESI. 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    Limitations Other (  comment)    Diagnostic tests MRI L-spine: (per radiologist report) Disc desiccation with mild intervertebral disc space narrowing. Left subarticular disc protrusion with mild inferior  migration. Disc material impinges upon and displaces the descending  left S1 nerve root as it courses through the left lateral recess    Pain Onset More than a month ago              Objective Findings Lumbar AROM Flexion 100% SB R/L 100% Rotate R/L 100%  Treatment performed   Manual therapy - for symptom modulation, soft tissue mobility to improve lumbopelvic/trunk ROM STM and DTM performed to L lower lumbar paraspinals, L gluteal  mm CPA and L UPA L3-S1 gr I-II for pain control/symptom modulation   Therapeutic exercise - for soft tissue mobility and flexibility, lumbar spine ROM, graded loading Lower trunk rotation with QL bias (figure-4 position); x10 each LE Piriformis stretch; 3x30 seconds L side today Seated sciatic glider with conjunct cervical extension/flexion; 2x10, 1 sec hold Child's pose; x10, 5 sec Bird dog, x10 alternating    Trigger Point Dry Needling (TDN), unbilled Education performed with patient regarding potential benefit of TDN. Reviewed precautions and risks with patient. Reviewed special precautions/risks over lung fields which include pneumothorax. Reviewed signs and symptoms of pneumothorax and advised pt to go to ER immediately if these symptoms develop advise them of dry needling treatment. Extensive time spent with pt to ensure full understanding of TDN risks. Pt provided verbal consent to treatment. TDN performed to L iliocostalis lumborum at L3, L L5 multifidus, L piriformis. with 0.30 x 60 single needle placements with local twitch response (LTR). Pistoning technique utilized at L piriformis and L iliocostalis lumborum. Improved pain-free motion following intervention.     ASSESSMENT Patient has made good progress with beginning of PT with patient having minimal complaints of back, gluteal, or lower extremity referred pain. Patient is able to perform full lumbar flexion and reach his feet in standing without reproduction of symptoms. He is able to perform seated sciatic glide without reproduction of referred symptoms. Patient has had fleeting pain when running outside with his dog when the dog abrupty tensioned leash during the run. Patient is progressing well at this time, and he will benefit from continued skilled therapeutic intervention to address deficits in ROM, pain, and ability to perform work-related activities for best return to PLOF.     PT Short Term Goals - 10/21/20 1810        PT SHORT TERM GOAL #1   Title Patient will be indepedent and 100% compliant with established HEP and activity modification as needed to augment PT intervention and prevent flare-up of patient condition.    Baseline No established home program, verbal cueing and demo for HEP performance    Time 2    Period Weeks    Status New    Target Date 11/04/20      PT SHORT TERM GOAL #2   Title Patient will have full thoracolumbar AROM without reproduction of pain as needed for reaching items on ground, household chores, bending.    Baseline Decreased lumbar flexion AROM, to patella at baseline    Time 3    Period Weeks    Status New    Target Date 11/11/20      PT SHORT TERM GOAL #3   Title Patient will perform modified hip hinge with dowel demonstrating improved ability to complete bending/hinging task with neutral spine and without reproduction of pain indicative of improved tolerance of  flexion-based tasks    Baseline Pain with bending in standing    Time 3    Period Weeks    Status New    Target Date 11/11/20               PT Long Term Goals - 10/21/20 2107       PT LONG TERM GOAL #1   Title Patient will demonstrate improved function as evidenced by a score of 77 on FOTO measure for full participation in activities at home and in the community.    Baseline FOTO 70    Time 6    Period Weeks    Status New    Target Date 12/03/20      PT LONG TERM GOAL #2   Title Patient will have no reproduction of pain with sciatic tensioner as needed for extending lower limb to operate foot controls on heavy machinery and complete self-care ADLs e.g. tying and donning footwear    Baseline Inability to assume SLUMP position/sciatic tensioner    Time 6    Period Weeks    Status New    Target Date 12/03/20      PT LONG TERM GOAL #3   Title Patient will have hip and core strength (per Sahrmann scale) 5/5 indicative of excellent lower quarter strength required for performing heavy physical  labor and operating heavy equipment at work    Baseline Hip flexor strength decreased, decreased capacity for loading core/gluteal mm    Time 6    Period Weeks    Status New    Target Date 12/03/20                   Plan - 10/31/20 0825     Clinical Impression Statement Patient has made good progress with beginning of PT with patient having minimal complaints of back, gluteal, or lower extremity referred pain. Patient is able to perform full lumbar flexion and reach his feet in standing without reproduction of symptoms. He is able to perform seated sciatic glide without reproduction of referred symptoms. Patient has had fleeting pain when running outside with his dog when the dog abrupty tensioned leash during the run. Patient is progressing well at this time, and he will benefit from continued skilled therapeutic intervention to address deficits in ROM, pain, and ability to perform work-related activities for best return to PLOF.    Personal Factors and Comorbidities Profession;Time since onset of injury/illness/exacerbation    Examination-Activity Limitations Bend;Dressing    Examination-Participation Restrictions Occupation    Stability/Clinical Decision Making Evolving/Moderate complexity    Rehab Potential Excellent    PT Frequency 2x / week    PT Duration 6 weeks    PT Treatment/Interventions Therapeutic activities;Therapeutic exercise;Manual techniques;Neuromuscular re-education;Dry needling;Spinal Manipulations;Joint Manipulations;Patient/family education    PT Next Visit Plan Extension principle management with progression of force per MDT guidelines as needed. DN at future sessions to address TrP referral pattern as needed. Graded movement and neurodynamics.    PT Home Exercise Plan HEP Access Code: [email or web address]  URL: [email or web address]  Date: 10/21/2020  Prepared by: Consuela MimesJeremy Kaely Hollan    Exercises  Prone Press Up - 5-6 x daily - 7 x weekly - 1 sets - 10 reps - 1sec  hold  Supine Hamstring Stretch - 2 x daily - 7 x weekly - 2 sets - 10 reps - 1sec hold    Consulted and Agree with Plan of Care Patient  Patient will benefit from skilled therapeutic intervention in order to improve the following deficits and impairments:  Hypomobility, Pain, Improper body mechanics  Visit Diagnosis: Left-sided low back pain with left-sided sciatica, unspecified chronicity  Left hip pain     Problem List Patient Active Problem List   Diagnosis Date Noted   Colitis 11/16/2015   Abdominal pain 11/16/2015   Consuela Mimes, PT, DPT [phone] Gertie Exon 10/31/2020, 8:34 AM  [facility] [address] [place], [place], [address] Phone: [phone]   Fax:  [phone]  Name: Shiro Ellerman MRN: [id number] Date of Birth: May 29, 1990

## 2020-10-31 ENCOUNTER — Encounter: Payer: Self-pay | Admitting: Physical Therapy

## 2020-11-03 ENCOUNTER — Encounter: Payer: BC Managed Care – PPO | Admitting: Physical Therapy

## 2020-11-04 ENCOUNTER — Encounter: Payer: BC Managed Care – PPO | Admitting: Physical Therapy

## 2020-11-04 ENCOUNTER — Ambulatory Visit: Payer: BC Managed Care – PPO | Admitting: Physical Therapy

## 2020-11-04 ENCOUNTER — Other Ambulatory Visit: Payer: Self-pay

## 2020-11-04 DIAGNOSIS — M25552 Pain in left hip: Secondary | ICD-10-CM

## 2020-11-04 DIAGNOSIS — M5442 Lumbago with sciatica, left side: Secondary | ICD-10-CM

## 2020-11-04 NOTE — Therapy (Signed)
[facility] [address] [place], [place], [address] Phone: [phone]   Fax:  [phone]  Physical Therapy Treatment  Patient Details  Name: Brandon Walsh MRN: [id number] Date of Birth: 31-Jan-1991 Referring Provider (PT): [name], MD   Encounter Date: 11/04/2020   PT End of Session - 11/04/20 0804     Visit Number 4    Number of Visits 13    Date for PT Re-Evaluation 12/02/20    Authorization Type BCBS Comm Pro: 30 visit scalendar year combined (PT/OT/chiro)    Authorization Time Period 10/21/20-12/02/20    Authorization - Number of Visits 4   Needs to be verified   Progress Note Due on Visit 10    PT Start Time 0732    PT Stop Time 0815    PT Time Calculation (min) 43 min    Activity Tolerance Patient tolerated treatment well;No increased pain    Behavior During Therapy [facility] for tasks assessed/performed             Past Medical History:  Diagnosis Date   Gastritis    GI bleed     Past Surgical History:  Procedure Laterality Date   COLONOSCOPY N/A 03/22/2016   Procedure: COLONOSCOPY;  Surgeon: Christena Deem, MD;  Location: [facility] ENDOSCOPY;  Service: Endoscopy;  Laterality: N/A;   Foreign body removed 3rd distal phalanx Right 2020   NO PAST SURGERIES     wisdom teeth removal      There were no vitals filed for this visit.   Subjective Assessment - 11/04/20 0733     Subjective Patient reports feeling some pain along L gluteal region with car transfer this AM. Patient reports he was moderately sore after last visit and did temporarily "cut back" on exercise. Patient reports feeling pain in L gluteal region when he is twisting.    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 Patient  reports that symptoms were mild at initial onset. He reports that symptoms have worsened. He reports that it got to point that it was hard to bend over and tie his shoe. He reports going to [place] in 01/2020 and was being treated for tendonitis; failed prednisone taper. Pt reports flare up in late 2021 during which he had difficulty bending over to tie his shoes. He reports his leg "gave out completely" when climbing onto service truck. Pt reports several visits to orthopedist as well as one episode of physical therapy at [facility] office (PT in Nov-Dec 2021) and was treated for piriformis syndrome. Pt was informed about disc bulge in L5-S1 region with compression of nerve root. Pt was given option of back surgery or ESI - pt elected to undergo ESI. Hx of ESI x 3 in lumbar spine (he states that 3rd ESI did reduce his pain drastically). Pt reports significant difficulty with L knee extension. Pt reports no numbness or paresthesias; he reports consant throb affecting LLE. He states it recently hasn't been as sharp. Pt denies recent night pain. Pt denies changes in bathroom habits. Pt reports he did have LLE weakness that has since improved. Aggravated by: sciatic nerve tensioner, bending, tying shoes. Alleviated by: lying down, most recent ESI. 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    Limitations Other (comment)    Diagnostic tests MRI L-spine: (per radiologist report) Disc desiccation with mild intervertebral disc space narrowing. Left subarticular disc protrusion with mild inferior  migration. Disc material impinges upon and displaces the descending  left S1 nerve root as it courses through the left lateral recess    Currently in Pain? Yes    Pain Score 1     Pain Onset More than a month ago              Objective Findings Lumbar AROM Flexion 50% p! R gluteal region   Treatment performed   Manual therapy - for symptom modulation, soft  tissue mobility to improve lumbopelvic/trunk ROM STM and DTM performed to L lower lumbar paraspinals, L gluteal mm CPA and L UPA L3-S1 gr I-II for pain control/symptom modulation   Therapeutic exercise - for soft tissue mobility and flexibility, lumbar spine ROM, graded loading Repeated extension in lying; x10 Repeated extension in lying; x10, with patient overpressure (inhale/exhale at end-range) Lower trunk rotation with QL bias (figure-4 position); x10 each LE Piriformis stretch; 1x60 seconds L side today Seated sciatic glider with conjunct cervical extension/flexion; 2x10, 1 sec hold Child's pose; x10, 5 sec Bird dog, 2x8 alternating   Patient education on force progression of repeated extension home program. HEP update and review.      Trigger Point Dry Needling (TDN), unbilled Education performed with patient regarding potential benefit of TDN. Reviewed precautions and risks with patient. Reviewed special precautions/risks over lung fields which include pneumothorax. Reviewed signs and symptoms of pneumothorax and advised pt to go to ER immediately if these symptoms develop advise them of dry needling treatment. Extensive time spent with pt to ensure full understanding of TDN risks. Pt provided verbal consent to treatment. TDN performed to L iliocostalis lumborum at L3 x 2, L L3 multifidus, L piriformis. with 0.30 x 60 single needle placements with local twitch response (LTR). Pistoning technique utilized at L piriformis and L iliocostalis lumborum. Improved pain-free motion following intervention.      ASSESSMENT Patient has responded well with repeated extension program and is able to improve pain-free lumbar spine flexion in standing following repeated extension in lying with patient overpressure. Patient is appropriate for modest progression of force with extension HEP. He has low level of pain this AM, but he does still have difficulty with abrupt trunk rotation and pain with lumbar  spine flexion during ADLs. Sound twitch is obtained at L3 paraspinal and L piriformis with mild post-dry needling soreness. Patient is responding well with current MDT progression and will benefit from continued skilled PT intervention to address deficits in ROM, pain, and ability to perform work-related activities for best return to PLOF.        PT Short Term Goals - 10/21/20 1810       PT SHORT TERM GOAL #1   Title Patient will be indepedent and 100% compliant with established HEP and activity modification as needed to augment PT intervention and prevent flare-up of patient condition.    Baseline No established home program, verbal cueing and demo for HEP performance    Time 2    Period Weeks    Status New    Target Date 11/04/20      PT SHORT TERM GOAL #2   Title Patient will have full thoracolumbar AROM without reproduction of pain as needed for reaching items on ground, household chores, bending.    Baseline Decreased  lumbar flexion AROM, to patella at baseline    Time 3    Period Weeks    Status New    Target Date 11/11/20      PT SHORT TERM GOAL #3   Title Patient will perform modified hip hinge with dowel demonstrating improved ability to complete bending/hinging task with neutral spine and without reproduction of pain indicative of improved tolerance of flexion-based tasks    Baseline Pain with bending in standing    Time 3    Period Weeks    Status New    Target Date 11/11/20               PT Long Term Goals - 10/21/20 2107       PT LONG TERM GOAL #1   Title Patient will demonstrate improved function as evidenced by a score of 77 on FOTO measure for full participation in activities at home and in the community.    Baseline FOTO 70    Time 6    Period Weeks    Status New    Target Date 12/03/20      PT LONG TERM GOAL #2   Title Patient will have no reproduction of pain with sciatic tensioner as needed for extending lower limb to operate foot controls on  heavy machinery and complete self-care ADLs e.g. tying and donning footwear    Baseline Inability to assume SLUMP position/sciatic tensioner    Time 6    Period Weeks    Status New    Target Date 12/03/20      PT LONG TERM GOAL #3   Title Patient will have hip and core strength (per Sahrmann scale) 5/5 indicative of excellent lower quarter strength required for performing heavy physical labor and operating heavy equipment at work    Baseline Hip flexor strength decreased, decreased capacity for loading core/gluteal mm    Time 6    Period Weeks    Status New    Target Date 12/03/20                   Plan - 11/04/20 0918     Clinical Impression Statement Patient has responded well with repeated extension program and is able to improve pain-free lumbar spine flexion in standing following repeated extension in lying with patient overpressure. Patient is appropriate for modest progression of force with extension HEP. He has low level of pain this AM, but he does still have difficulty with abrupt trunk rotation and pain with lumbar spine flexion during ADLs. Sound twitch is obtained at L3 paraspinal and L piriformis with mild post-dry needling soreness. Patient is responding well with current MDT progression and will benefit from continued skilled PT intervention to address deficits in ROM, pain, and ability to perform work-related activities for best return to PLOF.    Personal Factors and Comorbidities Profession;Time since onset of injury/illness/exacerbation    Examination-Activity Limitations Bend;Dressing    Examination-Participation Restrictions Occupation    Stability/Clinical Decision Making Evolving/Moderate complexity    Rehab Potential Excellent    PT Frequency 2x / week    PT Duration 6 weeks    PT Treatment/Interventions Therapeutic activities;Therapeutic exercise;Manual techniques;Neuromuscular re-education;Dry needling;Spinal Manipulations;Joint  Manipulations;Patient/family education    PT Next Visit Plan Extension principle management with progression of force per MDT guidelines as needed, currently utilizing paitent overpressure. DN at future sessions to address TrP referral pattern as needed. Graded movement and neurodynamics.    PT Home Exercise Plan Access Code: [email or web address]  URL: [email or web address]  Date: 11/04/2020  Prepared by: Consuela MimesJeremy Meara Wiechman    Exercises  Prone Press Up - 5-6 x daily - 7 x weekly - 1 sets - 10 reps - 1sec hold  Supine Hamstring Stretch - 2 x daily - 7 x weekly - 2 sets - 10 reps - 1sec hold  Seated Slump Nerve Glide - 2 x daily - 7 x weekly - 2 sets - 10 reps - 1sec hold  Supine Piriformis Stretch with Foot on Ground - 2 x daily - 7 x weekly - 3 sets - 30sec hold    Consulted and Agree with Plan of Care Patient             Patient will benefit from skilled therapeutic intervention in order to improve the following deficits and impairments:  Hypomobility, Pain, Improper body mechanics  Visit Diagnosis: Left-sided low back pain with left-sided sciatica, unspecified chronicity  Left hip pain     Problem List Patient Active Problem List   Diagnosis Date Noted   Colitis 11/16/2015   Abdominal pain 11/16/2015   Consuela MimesJeremy Lysbeth Dicola, PT, DPT [phone] Gertie ExonJeremy T Ambera Fedele 11/04/2020, 9:28 AM   [facility] [address] [place], [place], [address] Phone: [phone]   Fax:  [phone]  Name: Brandon LevinsJesse Walsh MRN: [id number] Date of Birth: 11-09-1990

## 2020-11-05 ENCOUNTER — Encounter: Payer: BC Managed Care – PPO | Admitting: Physical Therapy

## 2020-11-06 ENCOUNTER — Encounter: Payer: BC Managed Care – PPO | Admitting: Physical Therapy

## 2020-11-06 ENCOUNTER — Encounter: Payer: Self-pay | Admitting: Physical Therapy

## 2020-11-06 ENCOUNTER — Ambulatory Visit: Payer: BC Managed Care – PPO | Admitting: Physical Therapy

## 2020-11-06 ENCOUNTER — Other Ambulatory Visit: Payer: Self-pay

## 2020-11-06 DIAGNOSIS — M25552 Pain in left hip: Secondary | ICD-10-CM

## 2020-11-06 DIAGNOSIS — M5442 Lumbago with sciatica, left side: Secondary | ICD-10-CM | POA: Diagnosis not present

## 2020-11-06 NOTE — Therapy (Addendum)
[facility] [address] [place], [place], [address] Phone: [phone]   Fax:  [phone]  Physical Therapy Treatment  Patient Details  Name: Brandon Walsh MRN: [id number] Date of Birth: Feb 18, 1991 Referring Provider (PT): [name], MD   Encounter Date: 11/06/2020   PT End of Session - 11/06/20 1011     Visit Number 5    Number of Visits 13    Date for PT Re-Evaluation 12/02/20    Authorization Type BCBS Comm Pro: 30 visit scalendar year combined (PT/OT/chiro)    Authorization Time Period 10/21/20-12/02/20    Authorization - Number of Visits 5   Needs to be verified   Progress Note Due on Visit 10    PT Start Time 0731    PT Stop Time 0813    PT Time Calculation (min) 42 min    Activity Tolerance Patient tolerated treatment well;No increased pain    Behavior During Therapy [facility] for tasks assessed/performed             Past Medical History:  Diagnosis Date   Gastritis    GI bleed     Past Surgical History:  Procedure Laterality Date   COLONOSCOPY N/A 03/22/2016   Procedure: COLONOSCOPY;  Surgeon: Christena Deem, MD;  Location: [facility] ENDOSCOPY;  Service: Endoscopy;  Laterality: N/A;   Foreign body removed 3rd distal phalanx Right 2020   NO PAST SURGERIES     wisdom teeth removal      There were no vitals filed for this visit.   Subjective Assessment - 11/06/20 0732     Subjective Patient reports same 1/10 pain at arrival to PT. Patient reports he usually feels more pain when he first gets up and this gets better during the day. He states he has ongoing pain with lumbar flexion when he bends over to the floor e.g. to tie shoes.    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 Patient  reports that symptoms were mild at initial onset. He reports that symptoms have worsened. He reports that it got to point that it was hard to bend over and tie his shoe. He reports going to [facility] in 01/2020 and was being treated for tendonitis; failed prednisone taper. Pt reports flare up in late 2021 during which he had difficulty bending over to tie his shoes. He reports his leg "gave out completely" when climbing onto service truck. Pt reports several visits to orthopedist as well as one episode of physical therapy at [facility] office (PT in Nov-Dec 2021) and was treated for piriformis syndrome. Pt was informed about disc bulge in L5-S1 region with compression of nerve root. Pt was given option of back surgery or ESI - pt elected to undergo ESI. Hx of ESI x 3 in lumbar spine (he states that 3rd ESI did reduce his pain drastically). Pt reports significant difficulty with L knee extension. Pt reports no numbness or paresthesias; he reports consant throb affecting LLE. He states it recently hasn't been as sharp. Pt denies recent night pain. Pt denies changes in bathroom habits. Pt reports he did have LLE weakness that has since improved. Aggravated by: sciatic nerve tensioner, bending, tying shoes. Alleviated by: lying down, most recent ESI. Patient is still working full  time; patient works with heavy machinery and completes heavy physical labor.    Limitations Other (comment)    Diagnostic tests MRI L-spine: (per radiologist report) Disc desiccation with mild intervertebral disc space narrowing. Left subarticular disc protrusion with mild inferior  migration. Disc material impinges upon and displaces the descending  left S1 nerve root as it courses through the left lateral recess    Currently in Pain? Yes    Pain Score 1     Pain Location Buttocks    Pain Orientation Left    Pain Onset More than a month ago               Objective Findings Lumbar AROM Flexion 50% p! R gluteal region   Treatment  performed   Manual therapy - for symptom modulation, soft tissue mobility to improve lumbopelvic/trunk ROM STM and DTM performed to L lower lumbar paraspinals, L gluteal mm CPA and L UPA L3-S1 gr I-II for pain control/symptom modulation   Therapeutic exercise - for soft tissue mobility and flexibility, lumbar spine ROM, graded loading Repeated extension in lying; 2x10, with patient overpressure (inhale/exhale at end-range) Repeated extension in lying; x10, with clinician overpressure  Piriformis stretch; 1x60 seconds L side today Seated sciatic glider with conjunct cervical extension/flexion; 2x10, 1 sec hold Seated sciatic tensioner; x8, * fleeting reproduction of pain  Patient education on force progression and re-education of frequency of repeated extension home program. HEP update and review.    [*not today*] Lower trunk rotation with QL bias (figure-4 position); x10 each LE Bird dog, 2x8 alternating Child's pose; x10, 5 sec       Trigger Point Dry Needling (TDN), unbilled Education performed with patient regarding potential benefit of TDN. Reviewed precautions and risks with patient. Reviewed special precautions/risks over lung fields which include pneumothorax. Reviewed signs and symptoms of pneumothorax and advised pt to go to ER immediately if these symptoms develop advise them of dry needling treatment. Extensive time spent with pt to ensure full understanding of TDN risks. Pt provided verbal consent to treatment. TDN performed to L iliocostalis lumborum at L3, L L3 multifidus, L piriformis. with 0.30 x 60 single needle placements with local twitch response (LTR). Pistoning technique utilized at L piriformis and L iliocostalis lumborum. Improved pain-free motion following intervention.      Patient response to intervention: Pain-free lumbar flexion in standing and no notable pain at rest affecting L lower quarter or L gluteal region.   ASSESSMENT Patient has responded well to  intervention with improve tolerance of lumbar flexion following intervention the last 2 visits. Patient is sometimes inconsistent with repeated extension program outside of clinic, and this may limit carryover of progress obtained with PT. He does have low level of pain at this time, but he does have intermittent mobility deficits interfering with donning shoes/self-care. Patient is responding well with current MDT progression and will benefit from continued skilled PT intervention to address deficits in ROM, pain, and ability to perform work-related activities for best return to PLOF.       PT Short Term Goals - 10/21/20 1810       PT SHORT TERM GOAL #1   Title Patient will be indepedent and 100% compliant with established HEP and activity modification as needed to augment PT intervention and prevent flare-up of patient condition.    Baseline No established home program, verbal cueing and demo for HEP performance    Time 2    Period Weeks    Status New  Target Date 11/04/20      PT SHORT TERM GOAL #2   Title Patient will have full thoracolumbar AROM without reproduction of pain as needed for reaching items on ground, household chores, bending.    Baseline Decreased lumbar flexion AROM, to patella at baseline    Time 3    Period Weeks    Status New    Target Date 11/11/20      PT SHORT TERM GOAL #3   Title Patient will perform modified hip hinge with dowel demonstrating improved ability to complete bending/hinging task with neutral spine and without reproduction of pain indicative of improved tolerance of flexion-based tasks    Baseline Pain with bending in standing    Time 3    Period Weeks    Status New    Target Date 11/11/20               PT Long Term Goals - 10/21/20 2107       PT LONG TERM GOAL #1   Title Patient will demonstrate improved function as evidenced by a score of 77 on FOTO measure for full participation in activities at home and in the community.     Baseline FOTO 70    Time 6    Period Weeks    Status New    Target Date 12/03/20      PT LONG TERM GOAL #2   Title Patient will have no reproduction of pain with sciatic tensioner as needed for extending lower limb to operate foot controls on heavy machinery and complete self-care ADLs e.g. tying and donning footwear    Baseline Inability to assume SLUMP position/sciatic tensioner    Time 6    Period Weeks    Status New    Target Date 12/03/20      PT LONG TERM GOAL #3   Title Patient will have hip and core strength (per Sahrmann scale) 5/5 indicative of excellent lower quarter strength required for performing heavy physical labor and operating heavy equipment at work    Baseline Hip flexor strength decreased, decreased capacity for loading core/gluteal mm    Time 6    Period Weeks    Status New    Target Date 12/03/20                   Plan - 11/06/20 1016     Clinical Impression Statement Patient has responded well to intervention with improve tolerance of lumbar flexion following intervention the last 2 visits. Patient is sometimes inconsistent with repeated extension program outside of clinic, and this may limit carryover of progress obtained with PT. He does have low level of pain at this time, but he does have intermittent mobility deficits interfering with donning shoes/self-care. Patient is responding well with current MDT progression and will benefit from continued skilled PT intervention to address deficits in ROM, pain, and ability to perform work-related activities for best return to PLOF.    Personal Factors and Comorbidities Profession;Time since onset of injury/illness/exacerbation    Examination-Activity Limitations Bend;Dressing    Examination-Participation Restrictions Occupation    Stability/Clinical Decision Making Evolving/Moderate complexity    Rehab Potential Excellent    PT Frequency 2x / week    PT Duration 6 weeks    PT Treatment/Interventions  Therapeutic activities;Therapeutic exercise;Manual techniques;Neuromuscular re-education;Dry needling;Spinal Manipulations;Joint Manipulations;Patient/family education    PT Next Visit Plan Extension principle management with progression of force per MDT guidelines as needed, currently utilizing paitent overpressure. DN at future  sessions to address TrP referral pattern as needed. Graded movement and neurodynamics.    PT Home Exercise Plan Access Code: [email or web address]  URL: [email or web address]  Date: 11/04/2020  Prepared by: Consuela Mimes    Exercises  Prone Press Up - 5-6 x daily - 7 x weekly - 1 sets - 10 reps - 1sec hold  Supine Hamstring Stretch - 2 x daily - 7 x weekly - 2 sets - 10 reps - 1sec hold  Seated Slump Nerve Glide - 2 x daily - 7 x weekly - 2 sets - 10 reps - 1sec hold  Supine Piriformis Stretch with Foot on Ground - 2 x daily - 7 x weekly - 3 sets - 30sec hold    Consulted and Agree with Plan of Care Patient             Patient will benefit from skilled therapeutic intervention in order to improve the following deficits and impairments:  Hypomobility, Pain, Improper body mechanics  Visit Diagnosis: Left-sided low back pain with left-sided sciatica, unspecified chronicity  Left hip pain     Problem List Patient Active Problem List   Diagnosis Date Noted   Colitis 11/16/2015   Abdominal pain 11/16/2015   *Addended to correct incorrect text in treatments performed Consuela Mimes, PT, DPT [phone] Gertie Exon 11/07/2020, 7:55 AM  [facility] [address] [place], [place], [address] Phone: [phone]   Fax:  [phone]  Name: Brandon Walsh MRN: [id number] Date of Birth: November 08, 1990

## 2020-11-10 ENCOUNTER — Other Ambulatory Visit: Payer: Self-pay

## 2020-11-10 ENCOUNTER — Ambulatory Visit: Payer: BC Managed Care – PPO | Admitting: Physical Therapy

## 2020-11-10 ENCOUNTER — Encounter: Payer: BC Managed Care – PPO | Admitting: Physical Therapy

## 2020-11-10 DIAGNOSIS — M5442 Lumbago with sciatica, left side: Secondary | ICD-10-CM

## 2020-11-10 DIAGNOSIS — M25552 Pain in left hip: Secondary | ICD-10-CM

## 2020-11-10 NOTE — Therapy (Signed)
[facility] [address] [place], [place], [address] Phone: [phone]   Fax:  [phone]  Physical Therapy Treatment  Patient Details  Name: Brandon Walsh MRN: [id number] Date of Birth: [birth year] Referring Provider (PT): [name], MD   Encounter Date: 11/10/2020   PT End of Session - 11/11/20 2201     Visit Number 6    Number of Visits 13    Date for PT Re-Evaluation 12/02/20    Authorization Type BCBS Comm Pro: 30 visit scalendar year combined (PT/OT/chiro)    Authorization Time Period 10/21/20-12/02/20    Authorization - Number of Visits 6   Needs to be verified   Progress Note Due on Visit 10    PT Start Time 1801    PT Stop Time 1846    PT Time Calculation (min) 45 min    Activity Tolerance Patient tolerated treatment well;No increased pain    Behavior During Therapy [facility] for tasks assessed/performed             Past Medical History:  Diagnosis Date   Gastritis    GI bleed     Past Surgical History:  Procedure Laterality Date   COLONOSCOPY N/A 03/22/2016   Procedure: COLONOSCOPY;  Surgeon: Christena Deem, MD;  Location: [facility] ENDOSCOPY;  Service: Endoscopy;  Laterality: N/A;   Foreign body removed 3rd distal phalanx Right 2020   NO PAST SURGERIES     wisdom teeth removal      There were no vitals filed for this visit.   Subjective Assessment - 11/10/20 1803     Subjective Patient reports continuous 1/10 pain oftentimes during the day. He states he does sometimes have 0/10 pain. Patient reports compliance with his HEP. He states he was able to perform sciatic tensioner without pain yesterday. 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 Patient reports that symptoms were mild at initial onset. He reports that symptoms have worsened. He reports that it got to point that it was hard to bend over and tie his shoe. He reports going to [facility] in 01/2020 and was being treated for tendonitis; failed prednisone taper. Pt reports flare up in late 2021 during which he had difficulty bending over to tie his shoes. He reports his leg "gave out completely" when climbing onto service truck. Pt reports several visits to orthopedist as well as one episode of physical therapy at [facility] office (PT in Nov-Dec 2021) and was treated for piriformis syndrome. Pt was informed about disc bulge in L5-S1 region with compression of nerve root. Pt was given option of back surgery or ESI - pt elected to undergo ESI. Hx of ESI x 3 in lumbar spine (he states that 3rd ESI did reduce his pain drastically). Pt reports significant difficulty with L knee extension. Pt reports no numbness or paresthesias; he reports consant throb affecting LLE. He states it recently hasn't been as sharp. Pt denies recent night pain. Pt denies changes in bathroom habits. Pt reports he did have LLE weakness that has since improved. Aggravated by: sciatic nerve tensioner, bending, tying shoes. Alleviated by: lying down, most recent ESI. 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    Limitations Other (comment)    Diagnostic tests MRI L-spine: (per radiologist report) Disc desiccation with mild intervertebral disc space narrowing. Left subarticular disc protrusion with mild inferior  migration. Disc material impinges upon and displaces the descending  left S1 nerve root as it courses through the left lateral recess    Currently in Pain? Yes    Pain Score 1     Pain Onset More than a month ago               Objective Findings Lumbar AROM Flexion WFL with no reproduction of symptoms   Treatment performed Manual therapy -  for symptom modulation, soft tissue mobility to improve lumbopelvic/trunk ROM STM and DTM performed to L lower lumbar paraspinals, L gluteal mm CPA and L UPA L3-S1 gr I-II for pain control/symptom modulation DTM L piriformis with conjunct hip ER/IR for detonification technique   Therapeutic exercise - for soft tissue mobility and flexibility, lumbar spine ROM, graded loading Repeated extension in lying; 2x10, with clinician overpressure  Piriformis stretch; 1x60 seconds L side today Seated sciatic tensioner; x10, * fleeting reproduction of pain Seated sciatic glider; x10,  Cat Camel; x10 (p! L gluteal during PPT), quadruped Bird dog, 2x10 alternating with maintenance of neutral spine Bridge; 1x10, supine Physioball Bridge; 2x10, supine with Green physioball [for increased RPE]   [*not today*] Lower trunk rotation with QL bias (figure-4 position); x10 each LE Child's pose; x10, 5 sec        Patient response to intervention: Patient has abolished pain with repeated extension with clinician overpressure. He has reproduction of symptoms with flexion postures (even in unloaded position). This is abated following neurodynamics (gliders, patient still has pain with sciatic n. tensioner)     ASSESSMENT Patient is making progress per subjective report and does not have continuous significant pain during the day. He has intermittent pain with flexion-biased postures and positions. Pt is able to modestly progress with gluteal and core loading without notable increase in pain. Patient does respond well to intervention, but he does have frequent re-occurrence of L gluteal pain. Pt to continue with repeated extension with patient overpressure at home and continued neural and soft tissue mobility work. Patient is responding well with current MDT progression and will benefit from continued skilled PT intervention to address deficits in ROM, pain, and ability to perform work-related activities for best  return to PLOF.       PT Short Term Goals - 10/21/20 1810       PT SHORT TERM GOAL #1   Title Patient will be indepedent and 100% compliant with established HEP and activity modification as needed to augment PT intervention and prevent flare-up of patient condition.    Baseline No established home program, verbal cueing and demo for HEP performance    Time 2    Period Weeks    Status New    Target Date 11/04/20      PT SHORT TERM GOAL #2   Title Patient will have full thoracolumbar AROM without reproduction of pain as needed for reaching items on ground, household chores, bending.    Baseline Decreased lumbar flexion AROM, to patella at baseline    Time 3    Period Weeks    Status New    Target Date 11/11/20      PT SHORT TERM GOAL #3   Title Patient will perform modified hip hinge with dowel demonstrating improved ability to complete bending/hinging task  with neutral spine and without reproduction of pain indicative of improved tolerance of flexion-based tasks    Baseline Pain with bending in standing    Time 3    Period Weeks    Status New    Target Date 11/11/20               PT Long Term Goals - 10/21/20 2107       PT LONG TERM GOAL #1   Title Patient will demonstrate improved function as evidenced by a score of 77 on FOTO measure for full participation in activities at home and in the community.    Baseline FOTO 70    Time 6    Period Weeks    Status New    Target Date 12/03/20      PT LONG TERM GOAL #2   Title Patient will have no reproduction of pain with sciatic tensioner as needed for extending lower limb to operate foot controls on heavy machinery and complete self-care ADLs e.g. tying and donning footwear    Baseline Inability to assume SLUMP position/sciatic tensioner    Time 6    Period Weeks    Status New    Target Date 12/03/20      PT LONG TERM GOAL #3   Title Patient will have hip and core strength (per Sahrmann scale) 5/5 indicative of  excellent lower quarter strength required for performing heavy physical labor and operating heavy equipment at work    Baseline Hip flexor strength decreased, decreased capacity for loading core/gluteal mm    Time 6    Period Weeks    Status New    Target Date 12/03/20                   Plan - 11/11/20 2207     Clinical Impression Statement Patient is making progress per subjective report and does not have continuous significant pain during the day. He has intermittent pain with flexion-biased postures and positions. Pt is able to modestly progress with gluteal and core loading without notable increase in pain. Patient does respond well to intervention, but he does have frequent re-occurrence of L gluteal pain. Pt to continue with repeated extension with patient overpressure at home and continued neural and soft tissue mobility work. Patient is responding well with current MDT progression and will benefit from continued skilled PT intervention to address deficits in ROM, pain, and ability to perform work-related activities for best return to PLOF.    Personal Factors and Comorbidities Profession;Time since onset of injury/illness/exacerbation    Examination-Activity Limitations Bend;Dressing    Examination-Participation Restrictions Occupation    Stability/Clinical Decision Making Evolving/Moderate complexity    Rehab Potential Excellent    PT Frequency 2x / week    PT Duration 6 weeks    PT Treatment/Interventions Therapeutic activities;Therapeutic exercise;Manual techniques;Neuromuscular re-education;Dry needling;Spinal Manipulations;Joint Manipulations;Patient/family education    PT Next Visit Plan Extension principle management with progression of force per MDT guidelines as needed, currently utilizing paitent overpressure. DN at future sessions to address TrP referral pattern as needed. Graded movement and neurodynamics.    PT Home Exercise Plan Access Code: [email or web address]  URL:  [email or web address]  Date: 11/04/2020  Prepared by: Consuela Mimes    Exercises  Prone Press Up - 5-6 x daily - 7 x weekly - 1 sets - 10 reps - 1sec hold  Supine Hamstring Stretch - 2 x daily - 7 x weekly - 2 sets - 10 reps - 1sec hold  Seated Slump Nerve Glide - 2 x daily - 7 x weekly - 2 sets - 10 reps - 1sec hold  Supine Piriformis Stretch with Foot on Ground - 2 x daily - 7 x weekly - 3 sets - 30sec hold    Consulted and Agree with Plan of Care Patient             Patient will benefit from skilled therapeutic intervention in order to improve the following deficits and impairments:  Hypomobility, Pain, Improper body mechanics  Visit Diagnosis: Left-sided low back pain with left-sided sciatica, unspecified chronicity  Left hip pain     Problem List Patient Active Problem List   Diagnosis Date Noted   Colitis 11/16/2015   Abdominal pain 11/16/2015   Consuela MimesJeremy Bence Trapp, PT, DPT [phone] Gertie ExonJeremy T Braxston Quinter 11/11/2020, 10:09 PM  [facility] [address] [place], [place], [id number] Phone: [phone]   Fax:  [phone]  Name: Corwin LevinsJesse Rossa MRN: [id number] Date of Birth: 13-Aug-1990

## 2020-11-11 ENCOUNTER — Encounter: Payer: BC Managed Care – PPO | Admitting: Physical Therapy

## 2020-11-11 ENCOUNTER — Encounter: Payer: Self-pay | Admitting: Physical Therapy

## 2020-11-12 ENCOUNTER — Encounter: Payer: BC Managed Care – PPO | Admitting: Physical Therapy

## 2020-11-13 ENCOUNTER — Other Ambulatory Visit: Payer: Self-pay

## 2020-11-13 ENCOUNTER — Encounter: Payer: Self-pay | Admitting: Physical Therapy

## 2020-11-13 ENCOUNTER — Encounter: Payer: BC Managed Care – PPO | Admitting: Physical Therapy

## 2020-11-13 ENCOUNTER — Ambulatory Visit: Payer: BC Managed Care – PPO | Admitting: Physical Therapy

## 2020-11-13 DIAGNOSIS — M5442 Lumbago with sciatica, left side: Secondary | ICD-10-CM | POA: Diagnosis not present

## 2020-11-13 DIAGNOSIS — M25552 Pain in left hip: Secondary | ICD-10-CM

## 2020-11-13 NOTE — Therapy (Signed)
[place] [facility] [address] [place], [place], [address] Phone: [phone]   Fax:  [phone]  Physical Therapy Treatment  Patient Details  Name: Brandon LevinsJesse Mask MRN: [id number] Date of Birth: [birth year] Referring Provider (PT): Elijah BirkJennifer I Morales, MD   Encounter Date: 11/13/2020   PT End of Session - 11/13/20 0737     Visit Number 7    Number of Visits 13    Date for PT Re-Evaluation 12/02/20    Authorization Type BCBS Comm Pro: 30 visit scalendar year combined (PT/OT/chiro)    Authorization Time Period 10/21/20-12/02/20    Authorization - Visit Number 7    Authorization - Number of Visits 7   Needs to be verified   Progress Note Due on Visit 10    PT Start Time 0731    PT Stop Time 0814    PT Time Calculation (min) 43 min    Activity Tolerance Patient tolerated treatment well;No increased pain    Behavior [facility] for tasks assessed/performed             Past Medical History:  Diagnosis Date   Gastritis    GI bleed     Past Surgical History:  Procedure Laterality Date   COLONOSCOPY N/A 03/22/2016   Procedure: COLONOSCOPY;  Surgeon: [name] U Skulskie, MD;  [facility];  Service: Endoscopy;  Laterality: N/A;   Foreign body removed 3rd distal phalanx Right 2020   NO PAST SURGERIES     wisdom teeth removal      There were no vitals filed for this visit.   Subjective Assessment - 11/13/20 0732     Subjective Patient reports pain <1/10 at arrival to PT. Patient reports doing well with self-care/dressing ADLs this AM. Patient reports doing well with his home exercise program. He reports that piriformis stretch seems to help the most. Patient denies any major recent activity limitations.    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 Pt reports jumping off of service truck and having rapid onset of  sharp, throbbing pain in L glutal region in September. Patient reports that symptoms were mild at initial onset. He reports that symptoms have worsened. He reports that it got to point that it was hard to bend over and tie his shoe. He reports going to [place] in 01/[place] and was being treated for tendonitis; failed prednisone taper. Pt reports flare up in late 2021 during which he had difficulty bending over to tie his shoes. He reports his leg "gave out completely" when climbing onto service truck. Pt reports several visits to orthopedist as well as [facility]Dec [facility]) and was treated for piriformis syndrome. Pt was informed about disc bulge in L5-S1 region with compression of nerve root. Pt was given option of back surgery or ESI - pt elected to undergo ESI. Hx of ESI x 3 in lumbar spine (he states that 3rd ESI did reduce his pain drastically). Pt reports significant difficulty with L knee extension. Pt reports no numbness or paresthesias; he reports consant throb affecting LLE. He states it recently hasn't been as sharp. Pt denies recent night pain. Pt denies changes in bathroom habits. Pt reports he did have LLE weakness that has since improved. Aggravated by: sciatic nerve tensioner, bending, tying shoes. Alleviated by: lying down, most recent ESI. 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    Limitations Other (comment)    Diagnostic tests MRI L-spine: (per radiologist report) Disc desiccation with mild intervertebral disc space narrowing. Left subarticular disc protrusion with mild inferior  migration. Disc material impinges upon and displaces the descending  left S1 nerve root as it courses through the left lateral recess    Currently in Pain? Yes    Pain Score 1    "decimal" per patient or less than 1/10   Pain Location Buttocks    Pain Orientation Left    Pain Descriptors / Indicators Stabbing     Pain Onset More than a month ago                Objective Findings Lumbar AROM Flexion WFL with fleeting pain at end-range All other planes of motion WFL and pain-free    Treatment performed   Therapeutic exercise - for soft tissue mobility and flexibility, lumbar spine ROM, graded loading  Lower trunk rotation with QL bias (figure-4 position); x10 each LE Piriformis stretch; 1x60 seconds L side today Bird dog, 2x10 alternating with maintenance of neutral spine Cat Camel; x10  (no pain), quadruped Child's pose; x10, 5 sec Seated sciatic glider; 2x10  Patient education: reviewed existing home exercise program and discussed MD maintenance phase pending symptoms consistently remaining minimal for 30 days  *not today* Repeated extension in lying; 2x10, with clinician overpressure    Neuromuscular Re-education - for improved motor control with functional movements e.g. hip hinging, trunk stability  Physioball Bridge; 2x10, supine with Green physioball  Dying bug; 2x10, alternating Nautilus Pallof press; 50-lb; 20x each direction Modified hip hinge with dowel; x10   [*not today*] Seated sciatic tensioner; x10, * fleeting reproduction of pain         Patient response to intervention: Fleeting L gluteal pain with hip hinge to end-range. Patient exhibits normal L-spine AROM and patient reports no pain at end of session.      ASSESSMENT Patient has minimal symptoms at this time and has recently tolerated self-care activities and bending well. He has responded well with extension program and adjunct use of DN and neurodynamics. Patient exhibits normal lumbar spine active ROM. Program is progressed today to include additional isometric loading and functional movement e.g. hip hinge; new additions to exercise are tolerated well today. Patient is making good progress with symptom management and recovery of function at this time.  Pt to continue with repeated extension with  patient overpressure at home and continued neural and soft tissue mobility work. Patient will benefit from continued skilled PT intervention to address residual deficits in ROM, pain, and ability to perform work-related activities for best return to PLOF.      PT Short Term Goals - 10/21/20 1810       PT SHORT TERM GOAL #1   Title Patient will be indepedent and 100% compliant with established HEP and activity modification as needed to augment PT intervention and prevent flare-up of patient condition.    Baseline No established home program, verbal cueing and demo for HEP performance    Time 2    Period Weeks    Status New    Target Date 11/04/20      PT SHORT TERM GOAL #2   Title Patient will have full thoracolumbar AROM without reproduction of pain as needed for reaching items on ground, household chores, bending.    Baseline Decreased lumbar flexion AROM,  to patella at baseline    Time 3    Period Weeks    Status New    Target Date 11/11/20      PT SHORT TERM GOAL #3   Title Patient will perform modified hip hinge with dowel demonstrating improved ability to complete bending/hinging task with neutral spine and without reproduction of pain indicative of improved tolerance of flexion-based tasks    Baseline Pain with bending in standing    Time 3    Period Weeks    Status New    Target Date 11/11/20               PT Long Term Goals - 10/21/20 2107       PT LONG TERM GOAL #1   Title Patient will demonstrate improved function as evidenced by a score of 77 on FOTO measure for full participation in activities at home and in the community.    Baseline FOTO 70    Time 6    Period Weeks    Status New    Target Date 12/03/20      PT LONG TERM GOAL #2   Title Patient will have no reproduction of pain with sciatic tensioner as needed for extending lower limb to operate foot controls on heavy machinery and complete self-care ADLs e.g. tying and donning footwear    Baseline  Inability to assume SLUMP position/sciatic tensioner    Time 6    Period Weeks    Status New    Target Date 12/03/20      PT LONG TERM GOAL #3   Title Patient will have hip and core strength (per Sahrmann scale) 5/5 indicative of excellent lower quarter strength required for performing heavy physical labor and operating heavy equipment at work    Baseline Hip flexor strength decreased, decreased capacity for loading core/gluteal mm    Time 6    Period Weeks    Status New    Target Date 12/03/20             Plan - 11/14/20 0738     Clinical Impression Statement Patient has minimal symptoms at this time and has recently tolerated self-care activities and bending well. He has responded well with extension program and adjunct use of DN and neurodynamics. Patient exhibits normal lumbar spine active ROM. Program is progressed today to include additional isometric loading and functional movement e.g. hip hinge; new additions to exercise are tolerated well today. Patient is making good progress with symptom management and recovery of function at this time.  Pt to continue with repeated extension with patient overpressure at home and continued neural and soft tissue mobility work. Patient will benefit from continued skilled PT intervention to address residual deficits in ROM, pain, and ability to perform work-related activities for best return to PLOF.    Personal Factors and Comorbidities Profession;Time since onset of injury/illness/exacerbation    Examination-Activity Limitations Bend;Dressing    Examination-Participation Restrictions Occupation    Stability/Clinical Decision Making Evolving/Moderate complexity    Rehab Potential Excellent    PT Frequency 2x / week    PT Duration 6 weeks    PT Treatment/Interventions Therapeutic activities;Therapeutic exercise;Manual techniques;Neuromuscular re-education;Dry needling;Spinal Manipulations;Joint Manipulations;Patient/family education    PT Next  Visit Plan Extension principle management with progression of force per MDT guidelines as needed, currently utilizing patient overpressure. Graded movement/progressive loading and neurodynamics    PT Home Exercise Plan Access Code: [email or web address]  URL: [email or web address]  Date: 11/04/2020  Prepared by: Riki Rusk  Macklyn Glandon    Exercises  Prone Press Up - 5-6 x daily - 7 x weekly - 1 sets - 10 reps - 1sec hold  Supine Hamstring Stretch - 2 x daily - 7 x weekly - 2 sets - 10 reps - 1sec hold  Seated Slump Nerve Glide - 2 x daily - 7 x weekly - 2 sets - 10 reps - 1sec hold  Supine Piriformis Stretch with Foot on Ground - 2 x daily - 7 x weekly - 3 sets - 30sec hold    Consulted and Agree with Plan of Care Patient              Patient will benefit from skilled therapeutic intervention in order to improve the following deficits and impairments:  Hypomobility, Pain, Improper body mechanics  Visit Diagnosis: Left-sided low back pain with left-sided sciatica, unspecified chronicity  Left hip pain     Problem List Patient Active Problem List   Diagnosis Date Noted   Colitis 11/16/2015   Abdominal pain 11/16/2015   Consuela Mimes, PT, DPT [phone] Gertie Exon 11/14/2020, 7:41 AM  [facility] [address] [place], [place], [address] Phone: [phone]   Fax:  [phone]  Name: Karol Liendo MRN: [id number] Date of Birth: 01-Jul-1990

## 2020-11-17 ENCOUNTER — Encounter: Payer: BC Managed Care – PPO | Admitting: Physical Therapy

## 2020-11-18 ENCOUNTER — Encounter: Payer: BC Managed Care – PPO | Admitting: Physical Therapy

## 2020-11-18 ENCOUNTER — Other Ambulatory Visit: Payer: Self-pay

## 2020-11-18 ENCOUNTER — Ambulatory Visit: Payer: BC Managed Care – PPO | Admitting: Physical Therapy

## 2020-11-18 ENCOUNTER — Encounter: Payer: Self-pay | Admitting: Physical Therapy

## 2020-11-18 DIAGNOSIS — M25552 Pain in left hip: Secondary | ICD-10-CM

## 2020-11-18 DIAGNOSIS — M5442 Lumbago with sciatica, left side: Secondary | ICD-10-CM | POA: Diagnosis not present

## 2020-11-18 NOTE — Therapy (Signed)
[place] [facility] [address] [place], [place], [address] Phone: [phone]   Fax:  [phone]  Physical Therapy Treatment  Patient Details  Name: Brandon Walsh MRN: [id number] Date of Birth: [birth year] Referring Provider (PT): Doyle Askew, MD   Encounter Date: 11/18/2020   PT End of Session - 11/18/20 1751     Visit Number 8    Number of Visits 13    Date for PT Re-Evaluation 12/02/20    Authorization Type BCBS Comm Pro: 30 visit scalendar year combined (PT/OT/chiro)    Authorization Time Period 10/21/20-12/02/20    Authorization - Visit Number 8    Authorization - Number of Visits 8   Needs to be verified   Progress Note Due on Visit 10    PT Start Time 0730    PT Stop Time 0815    PT Time Calculation (min) 45 min    Activity Tolerance Patient tolerated treatment well;No increased pain    Behavior During Therapy [facility] for tasks assessed/performed             Past Medical History:  Diagnosis Date   Gastritis    GI bleed     Past Surgical History:  Procedure Laterality Date   COLONOSCOPY N/A 03/22/2016   Procedure: COLONOSCOPY;  Surgeon: Lollie Sails, MD;  [facility]: [facility] END[facility]COPY;  Service: Endoscopy;  Laterality: N/A;   Foreign body removed 3rd distal phalanx Right 2020   NO PAST SURGERIES     wisdom teeth removal      There were no vitals filed for this visit.   Subjective Assessment - 11/18/20 0732     Subjective Patient reports having minimal symptoms the last few days. He reports doing well at work. He reports some pain with sciatic n. tensioner. Patient reports no pain at rest in sitting or with standing today. He reports doing well with donning/doffing shoes and self-care ADLs.    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 Patient reports that symptoms were mild at initial onset. He reports that symptoms have worsened. He reports that it got to point that it was hard to bend over and tie his shoe. He reports going to [place] in 01/2020 and was being treated for tendonitis; failed prednisone taper. Pt reports flare up in late 2021 during which he had difficulty bending over to tie his shoes. He reports his leg "gave out completely" when climbing onto service truck. Pt reports several visits to orthopedist as well as one episode of physical therapy at [facility] office (PT in Nov-Dec 2021) and was treated for piriformis syndrome. Pt was informed about disc bulge in L5-S1 region with compression of nerve root. Pt was given option of back surgery or ESI - pt elected to undergo ESI. Hx of ESI x 3 in lumbar spine (he states that 3rd ESI did reduce his pain drastically). Pt reports significant difficulty with L knee extension. Pt reports no numbness or paresthesias; he reports consant throb affecting LLE. He states it recently hasn't been as sharp. Pt denies recent night pain. Pt denies changes in bathroom habits. Pt reports he did have LLE weakness that has since improved. Aggravated by: sciatic nerve tensioner, bending, tying shoes. Alleviated by: lying down, most recent ESI.  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    Limitations Other (comment)    Diagnostic tests MRI L-spine: (per radiologist report) Disc desiccation with mild intervertebral disc space narrowing. Left subarticular disc protrusion with mild inferior  migration. Disc material impinges upon and displaces the descending  left S1 nerve root as it courses through the left lateral recess    Currently in Pain? No/denies    Pain Onset More than a month ago              Treatment performed   Therapeutic exercise - for soft tissue mobility and flexibility, lumbar  spine ROM, graded loading   Lower trunk rotation with QL bias (figure-4 position); x10 each LE Piriformis stretch; 1x60 seconds L side today Bird dog, 1x10 with 5 sec isometric hold; alternating with maintenance of neutral spine Cat Camel; x10  (no pain), quadruped Seated sciatic glider; 2x10       *not today* Repeated extension in lying; 2x10, with clinician overpressure  Child's pose; x10, 5 sec   Neuromuscular Re-education - for improved motor control with functional movements e.g. hip hinging, trunk stability, lifting   Physioball Bridge; 1x10, 5 sec hold; supine with Green physioball  Dying bug; 1x10 in hooklying and 1x10 with hips flexed 90 deg, alternating Nautilus Pallof press; 50-lb; 20x each direction Nautilus Deadlift with neutral spine; 1x10 [verbal cueing for maintenance of neutral spine with hip hinge] Modified hip hinge with dowel; x10 Single-limb RDL; 1x10, bilat Prone alternating opposite UE/LE (hip EXT, shoulder flexion); x10 alternating     [*not today*] Seated sciatic tensioner; x10, * fleeting reproduction of pain          Patient response to intervention: Fleeting L gluteal pain with single-limb RDL. Patient exhibits normal L-spine AROM and patient reports no pain at end of session.      ASSESSMENT Patient has minimal symptoms at this time and has recently tolerated self-care activities and bending well. Patient is tolerating work duties well with no major complaint. He does have mild fleeting symptoms intermittently during the day. He reports minimal functional limitations at this time. Patient is making excellent progress. Patient will benefit from continued skilled PT intervention to address residual deficits in fleeting pain and ability to perform work-related and self-care activities for best return to PLOF.      PT Short Term Goals - 11/18/20 1757       PT SHORT TERM GOAL #1   Title Patient will be indepedent and 100% compliant with established  HEP and activity modification as needed to augment PT intervention and prevent flare-up of patient condition.    Baseline No established home program, verbal cueing and demo for HEP performance    Time 2    Period Weeks    Status Achieved    Target Date 11/04/20      PT SHORT TERM GOAL #2   Title Patient will have full thoracolumbar AROM without reproduction of pain as needed for reaching items on ground, household chores, bending.    Baseline Decreased lumbar flexion AROM, to patella at baseline    Time 3    Period Weeks    Status Partially Met    Target Date 11/11/20      PT SHORT TERM GOAL #3   Title Patient will perform modified hip hinge with dowel demonstrating improved ability to complete bending/hinging task with neutral spine and without reproduction of pain indicative of improved tolerance  of flexion-based tasks    Baseline Pain with bending in standing    Time 3    Period Weeks    Status Achieved    Target Date 11/11/20               PT Long Term Goals - 10/21/20 2107       PT LONG TERM GOAL #1   Title Patient will demonstrate improved function as evidenced by a score of 77 on FOTO measure for full participation in activities at home and in the community.    Baseline FOTO 70    Time 6    Period Weeks    Status New    Target Date 12/03/20      PT LONG TERM GOAL #2   Title Patient will have no reproduction of pain with sciatic tensioner as needed for extending lower limb to operate foot controls on heavy machinery and complete self-care ADLs e.g. tying and donning footwear    Baseline Inability to assume SLUMP position/sciatic tensioner    Time 6    Period Weeks    Status New    Target Date 12/03/20      PT LONG TERM GOAL #3   Title Patient will have hip and core strength (per Sahrmann scale) 5/5 indicative of excellent lower quarter strength required for performing heavy physical labor and operating heavy equipment at work    Baseline Hip flexor strength  decreased, decreased capacity for loading core/gluteal mm    Time 6    Period Weeks    Status New    Target Date 12/03/20               Plan - 11/18/20 1757     Clinical Impression Statement Patient has minimal symptoms at this time and has recently tolerated self-care activities and bending well. Patient is tolerating work duties well with no major complaint. He does have mild fleeting symptoms intermittently during the day. He reports minimal functional limitations at this time. Patient is making excellent progress. Patient will benefit from continued skilled PT intervention to address residual deficits in fleeting pain and ability to perform work-related and self-care activities for best return to PLOF.    Personal Factors and Comorbidities Profession;Time since onset of injury/illness/exacerbation    Examination-Activity Limitations Bend;Dressing    Examination-Participation Restrictions Occupation    Stability/Clinical Decision Making Evolving/Moderate complexity    Rehab Potential Excellent    PT Frequency 2x / week    PT Duration 6 weeks    PT Treatment/Interventions Therapeutic activities;Therapeutic exercise;Manual techniques;Neuromuscular re-education;Dry needling;Spinal Manipulations;Joint Manipulations;Patient/family education    PT Next Visit Plan Extension principle management with progression of force per MDT guidelines as needed, currently utilizing patient overpressure. Graded movement/progressive loading and neurodynamics    PT Home Exercise Plan Access Code: [email or web address]  URL: [email or web address]  Date: 11/04/2020  Prepared by: Valentina Gu    Exercises  Prone Press Up - 5-6 x daily - 7 x weekly - 1 sets - 10 reps - 1sec hold  Supine Hamstring Stretch - 2 x daily - 7 x weekly - 2 sets - 10 reps - 1sec hold  Seated Slump Nerve Glide - 2 x daily - 7 x weekly - 2 sets - 10 reps - 1sec hold  Supine Piriformis Stretch with Foot on Ground - 2 x daily - 7 x weekly  - 3 sets - 30sec hold    Consulted and Agree with Plan of Care Patient  Patient will benefit from skilled therapeutic intervention in order to improve the following deficits and impairments:  Hypomobility, Pain, Improper body mechanics  Visit Diagnosis: Left-sided low back pain with left-sided sciatica, unspecified chronicity  Left hip pain     Problem List Patient Active Problem List   Diagnosis Date Noted   Colitis 11/16/2015   Abdominal pain 11/16/2015   Valentina Gu, PT, DPT [phone] Eilleen Kempf 11/18/2020, 5:59 PM  [facility] [address] [place], [place], [address] Phone: [phone]   Fax:  [phone]  Name: Brandon Walsh MRN: [id number] Date of Birth: 03/04/1991

## 2020-11-19 ENCOUNTER — Encounter: Payer: BC Managed Care – PPO | Admitting: Physical Therapy

## 2020-11-20 ENCOUNTER — Encounter: Payer: BC Managed Care – PPO | Admitting: Physical Therapy

## 2020-11-25 ENCOUNTER — Ambulatory Visit: Payer: BC Managed Care – PPO | Attending: Physical Medicine & Rehabilitation | Admitting: Physical Therapy

## 2020-11-25 ENCOUNTER — Other Ambulatory Visit: Payer: Self-pay

## 2020-11-25 ENCOUNTER — Encounter: Payer: Self-pay | Admitting: Physical Therapy

## 2020-11-25 DIAGNOSIS — M5442 Lumbago with sciatica, left side: Secondary | ICD-10-CM | POA: Insufficient documentation

## 2020-11-25 DIAGNOSIS — M25552 Pain in left hip: Secondary | ICD-10-CM | POA: Diagnosis present

## 2020-11-25 NOTE — Therapy (Signed)
[place] [facility] [address]. [place], [place], [address] Phone: [phone]   Fax:  [phone]  Physical Therapy Treatment/ Recertification Note   Dates of reporting period  10/21/2020   to  11/25/2020  Patient Details  Name: Brandon Walsh MRN: [id number] Date of Birth: 1991-04-12 Referring Provider (PT): Doyle Askew, MD   Encounter Date: 11/25/2020   PT End of Session - 11/25/20 0744     Visit Number 9    Number of Visits 13    Date for PT Re-Evaluation 12/02/20    Authorization Type [employer] Comm Pro: 30 visit scalendar year combined (PT/OT/chiro)    Authorization Time Period 10/21/20-12/02/20    Authorization - Visit Number 9    Authorization - Number of Visits 9   Needs to be verified   Progress Note Due on Visit 13    PT Start Time 0732    PT Stop Time 0818    PT Time Calculation (min) 46 min    Activity Tolerance Patient tolerated treatment well;No increased pain    Behavior During Therapy [facility] for tasks assessed/performed             Past Medical History:  Diagnosis Date   Gastritis    GI bleed     Past Surgical History:  Procedure Laterality Date   COLONOSCOPY N/A 03/22/2016   Procedure: COLONOSCOPY;  Surgeon: Lollie Sails, MD;  Location: [facility] ENDOSCOPY;  Service: Endoscopy;  Laterality: N/A;   Foreign body removed 3rd distal phalanx Right 2020   NO PAST SURGERIES     wisdom teeth removal      There were no vitals filed for this visit.   Subjective Assessment - 11/25/20 0737     Subjective Patient reports difficulty with sitting in truck for 3 hours driving to beach over the weekend - he had mild pain and had to keep his LLE propped in front of him during the ride. He reports pain with playing basketball with his son, swimming, and lifting cooler. He reports pain is usually in same region at L gluteal mm. Patient reports 20-30% improvement at this time with symptoms being highly variable. He reports some  days are essentially pain-free, but he has random pain with "turning a certain way" or abrupt twisting. Patient reports he is usually kneeling versus bending in low back to reach for low-lying item. He reports pain with bringing his R knee to his chest. Patient reports increase in pain with bowel movement. He feels that riding in vehicle is easier that it was. He states symptoms have changed with therapeutic intervention and home program.    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 Patient reports that symptoms were mild at initial onset. He reports that symptoms have worsened. He reports that it got to point that it was hard to bend over and tie his shoe. He reports going to [place] in 01/2020 and was being treated for tendonitis; failed prednisone taper. Pt reports flare up in late 2021 during which he had difficulty bending over to tie his shoes. He reports his leg "gave out completely" when climbing onto service truck. Pt reports several visits to orthopedist as well as one episode of physical therapy at [facility] office (PT in [place]  2021) and was treated for piriformis syndrome. Pt was informed about disc bulge in L5-S1 region with compression of nerve root. Pt was given option of back surgery or ESI - pt elected to undergo ESI. Hx of ESI x 3 in lumbar spine (he states that 3rd ESI did reduce his pain drastically). Pt reports significant difficulty with L knee extension. Pt reports no numbness or paresthesias; he reports consant throb affecting LLE. He states it recently hasn't been as sharp. Pt denies recent night pain. Pt denies changes in bathroom habits. Pt reports he did have LLE weakness that has since improved. Aggravated by: sciatic nerve tensioner, bending, tying shoes. Alleviated  by: lying down, most recent ESI. 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    Limitations Other (comment)    Diagnostic tests MRI L-spine: (per radiologist report) Disc desiccation with mild intervertebral disc space narrowing. Left subarticular disc protrusion with mild inferior  migration. Disc material impinges upon and displaces the descending  left S1 nerve root as it courses through the left lateral recess    Currently in Pain? Yes    Pain Score 1     Pain Location Buttocks    Pain Orientation Left    Pain Onset More than a month ago             OBJECTIVE FINDINGS  Palpation Tenderness to palpation along L piriformis (1+)      Strength (out of 5) R/L 5/5 Hip flexion 5/5 Hip ER 5/5 Hip IR 5/5 Hip adduction 5/5 Knee extension (p! With L quad MMT position) 5/5 Knee flexion 5/5 Ankle dorsiflexion 5/5 Hallux extension  4/5 Core strength, Sahrmann scale    *Indicates pain   AROM (degrees) R/L (all movements include overpressure unless otherwise stated) Lumbar forward flexion: to shin* (L gluteal pain intermittently through ROM ) Lumbar extension (30): 100% Lumbar lateral flexion (25): R: 100%  L: 100% Thoracic and Lumbar rotation (30 degrees):  R: 100% L: 100% Hip IR (0-45): R: 45, L: 45 Hip ER (0-45): R: 45, L: 45 Hip Flexion (0-125): R: WNL, L WNL *Indicates pain     Repeated Movements Repeated extension in lying with patient overpressure: no effect, abolished (while in lying)   Muscle Length Hamstrings: R: 30 degrees L: 30 degrees Ely: R: normal, L: normal   Passive Accessory Intervertebral Motion (PAIVM) Pt denies reproduction of back pain with CPA L1-L5 and UPA bilaterally L1-L5. Hypomobile with L3-L5 CPA, bilateral UPA         Treatment performed   Therapeutic exercise - for soft tissue mobility and flexibility, lumbar spine ROM, graded loading   Repeated extension in lying; 2x10,  with patient overpressure  Piriformis stretch; 3x30sec seconds L side today  Patient education: HEP update and review       *not today* Bird dog, 1x10 with 5 sec isometric hold; alternating with maintenance of neutral spine Cat Camel; x10  (no pain), quadruped Seated sciatic glider; 2x10 Lower trunk rotation with QL bias (figure-4 position); x10 each LE Repeated extension in lying; 2x10, with clinician overpressure  Child's pose; x10, 5 sec    Neuromuscular Re-education - for improved motor control with functional movements e.g. hip hinging, trunk stability, lifting  Prone alternating opposite UE/LE (hip EXT, shoulder flexion); x10 alternating   *next visit* Physioball Bridge; 1x10, 5 sec hold; supine with Green physioball  Dying bug; 1x10 in hooklying and 1x10 with hips flexed 90 deg, alternating  Nautilus Pallof press; 50-lb; 20x each direction Nautilus Deadlift with neutral spine; 1x10 [verbal cueing for maintenance of neutral spine with hip hinge] Modified hip hinge with dowel; x10 Single-limb RDL; 1x10, bilat    *not today* Seated sciatic tensioner; x10, * fleeting reproduction of pain     Manual Therapy - for symptom modulation, soft tissue sensitivity and mobility, joint mobility, ROM   STM/DTM to L gluteal mm, L piriformis    Trigger Point Dry Needling (TDN), unbilled Education performed with patient regarding potential benefit of TDN. Reviewed precautions and risks with patient. Extensive time spent with pt to ensure full understanding of TDN risks. Pt provided verbal consent to treatment. TDN performed to L piriformis with 0.25 x 75 single needle placements with local twitch response (LTR). Pistoning technique utilized. Improved pain-free motion following intervention.      Patient response to intervention: No gluteal pain following TDN and pain-free leaving clinic today     ASSESSMENT Patient has notably improved in ability to complete thoracolumbar flexion  activities. Patient is making excellent progress to date with 2/3 short-term goals met and 1/3 long-term goals met; patient has made substantial progress toward other established goals. He has improved flexibility of posterior chain musculature as exhibited by hamstring 90/90 test. He reports pain consistently around 1/10, and he is not experiencing constant symptoms at this time. However, he does experience intermittent L gluteal pain with high-impact recreational activities (e.g. basketball, jogging) and intermittently with bending and abruptly rotating thoracolumbar spine. He has excellent myotomal strength bilaterally and good core strength. Symptoms are reproduced along L piriformis/gemelli complex today with palpation. Sound twitch response is obtained at L piriformis with dry needling with no pain reported after. Patient will benefit from continued skilled PT intervention to address residual deficits in fleeting pain and ability to perform work-related and self-care activities for best return to PLOF. Will gradually transition to independent home program toward end of POC.     PT Short Term Goals - 11/25/20 0846       PT SHORT TERM GOAL #1   Title Patient will be indepedent and 100% compliant with established HEP and activity modification as needed to augment PT intervention and prevent flare-up of patient condition.    Baseline IE: No established home program, verbal cueing and demo for HEP performance; 11/25/2020: compliant, verbalizes established program    Time 2    Period Weeks    Status Achieved    Target Date 11/04/20      PT SHORT TERM GOAL #2   Title Patient will have full thoracolumbar AROM without reproduction of pain as needed for reaching items on ground, household chores, bending.    Baseline IE: Decreased lumbar flexion AROM, to patella at baseline; 11/25/2020: lumbar flexion WFL with intermittent pain with bending    Time 3    Period Weeks    Status Partially Met    Target Date  11/11/20      PT SHORT TERM GOAL #3   Title Patient will perform modified hip hinge with dowel demonstrating improved ability to complete bending/hinging task with neutral spine and without reproduction of pain indicative of improved tolerance of flexion-based tasks    Baseline IE: Pain with bending in standing; 11/25/2020: performed with progressive exercise at previous visit with sound hip hinge/neutral spine    Time 3    Period Weeks    Status Achieved    Target Date 11/11/20  PT Long Term Goals - 11/25/20 3818       PT LONG TERM GOAL #1   Title Patient will demonstrate improved function as evidenced by a score of 77 on FOTO measure for full participation in activities at home and in the community.    Baseline IE: FOTO 70; 11/25/2020: 77    Time 6    Period Weeks    Status Achieved    Target Date 12/03/20      PT LONG TERM GOAL #2   Title Patient will have no reproduction of pain with sciatic tensioner as needed for extending lower limb to operate foot controls on heavy machinery and complete self-care ADLs e.g. tying and donning footwear    Baseline IE: Inability to assume SLUMP position/sciatic tensioner; 11/25/2020: fleeting pain with sciatic tensioner in sitting, no pain with flossing without cervical spine flexion    Time 6    Period Weeks    Status Partially Met      PT LONG TERM GOAL #3   Title Patient will have hip and core strength (per Sahrmann scale) 5/5 indicative of excellent lower quarter strength required for performing heavy physical labor and operating heavy equipment at work    Baseline IE: Hip flexor strength decreased, decreased capacity for loading core/gluteal mm; 11/25/2020: Sahrmann scale core strength 4/5    Time 6    Period Weeks    Status Partially Met                   Plan - 11/25/20 1636     Clinical Impression Statement Patient has notably improved in ability to complete thoracolumbar flexion activities. Patient is  making excellent progress to date with 2/3 short-term goals met and 1/3 long-term goals met; patient has made substantial progress toward other established goals. He has improved flexibility of posterior chain musculature as exhibited by hamstring 90/90 test. He reports pain consistently around 1/10, and he is not experiencing constant symptoms at this time. However, he does experience intermittent L gluteal pain with high-impact recreational activities (e.g. basketball, jogging) and intermittently with bending and abruptly rotating thoracolumbar spine. He has excellent myotomal strength bilaterally and good core strength. Symptoms are reproduced along L piriformis/gemelli complex today with palpation. Sound twitch response is obtained at L piriformis with dry needling with no pain reported after. Patient will benefit from continued skilled PT intervention to address residual deficits in fleeting pain and ability to perform work-related and self-care activities for best return to PLOF. Will gradually transition to independent home program toward end of POC.    Personal Factors and Comorbidities Profession;Time since onset of injury/illness/exacerbation    Examination-Activity Limitations Bend;Dressing    Examination-Participation Restrictions Occupation    Stability/Clinical Decision Making Evolving/Moderate complexity    Rehab Potential Excellent    PT Frequency 2x / week    PT Duration 6 weeks    PT Treatment/Interventions Therapeutic activities;Therapeutic exercise;Manual techniques;Neuromuscular re-education;Dry needling;Spinal Manipulations;Joint Manipulations;Patient/family education    PT Next Visit Plan Ongoing extension principle management with progression of force per MDT guidelines as needed, currently utilizing patient overpressure. Graded movement/progressive loading and neurodynamics. Transition to HEP toward end of remaining visits.    PT Home Exercise Plan Access Code: [email or web address]  URL:  [email or web address]  Date: 11/04/2020  Prepared by: Valentina Gu    Exercises  Prone Press Up - 5-6 x daily - 7 x weekly - 1 sets - 10 reps - 1sec hold  Supine Hamstring Stretch - 2 x daily -  7 x weekly - 2 sets - 10 reps - 1sec hold  Seated Slump Nerve Glide - 2 x daily - 7 x weekly - 2 sets - 10 reps - 1sec hold  Supine Piriformis Stretch with Foot on Ground - 2 x daily - 7 x weekly - 3 sets - 30sec hold    Consulted and Agree with Plan of Care Patient             Patient will benefit from skilled therapeutic intervention in order to improve the following deficits and impairments:  Hypomobility, Pain, Improper body mechanics  Visit Diagnosis: Left-sided low back pain with left-sided sciatica, unspecified chronicity  Left hip pain     Problem List Patient Active Problem List   Diagnosis Date Noted   Colitis 11/16/2015   Abdominal pain 11/16/2015   Valentina Gu, PT, DPT #[id number] Eilleen Kempf 11/25/2020, 6:13 PM  [facility] [address] [place], [place], [address] Phone: [phone]   Fax:  [phone]  Name: Brandon Walsh MRN: [id number] Date of Birth: [birth year]

## 2020-11-27 ENCOUNTER — Encounter: Payer: BC Managed Care – PPO | Admitting: Physical Therapy

## 2020-12-02 ENCOUNTER — Encounter: Payer: BC Managed Care – PPO | Admitting: Physical Therapy

## 2020-12-03 ENCOUNTER — Ambulatory Visit: Payer: BC Managed Care – PPO | Admitting: Physical Therapy

## 2020-12-03 ENCOUNTER — Other Ambulatory Visit: Payer: Self-pay

## 2020-12-03 DIAGNOSIS — M5442 Lumbago with sciatica, left side: Secondary | ICD-10-CM

## 2020-12-03 DIAGNOSIS — M25552 Pain in left hip: Secondary | ICD-10-CM

## 2020-12-03 NOTE — Therapy (Signed)
[facility] [address] [place], [place], [address] Phone: [phone]   Fax:  [phone]  Physical Therapy Treatment  Patient Details  Name: Kostas Marrow MRN: [id number] Date of Birth: 10-Oct-1990 Referring Provider (PT): Doyle Askew, MD   Encounter Date: 12/03/2020   PT End of Session - 12/03/20 0914     Visit Number 10    Number of Visits 13    Date for PT Re-Evaluation 12/02/20    Authorization Type BC[employer] Comm Pro: 30 visit scalendar year combined (PT/OT/chiro)    Authorization Time Period 10/21/20-12/02/20    Authorization - Visit Number 10    Authorization - Number of Visits 10   Needs to be verified   Progress Note Due on Visit 13    PT Start Time 0800    PT Stop Time 0845    PT Time Calculation (min) 45 min    Activity Tolerance Patient tolerated treatment well;No increased pain    Behavior During Therapy [facility] for tasks assessed/performed             Past Medical History:  Diagnosis Date   Gastritis    GI bleed     Past Surgical History:  Procedure Laterality Date   COLONOSCOPY N/A 03/22/2016   Procedure: COLONOSCOPY;  Surgeon: Lollie Sails, MD;  Location: [facility] ENDOSCOPY;  Service: Endoscopy;  Laterality: N/A;   Foreign body removed 3rd distal phalanx Right 2020   NO PAST SURGERIES     wisdom teeth removal      There were no vitals filed for this visit.   Subjective Assessment - 12/03/20 0800     Subjective Patient strained his mid-back when lifting at work this past Monday. He reports being given Naproxen and muscle relaxer following worker's compensation claim. Patient reports that with his current medicine, he has no pain and tolerates tensioner without pain. Patient reports compliance with his HEP. He reports some fleeting difficulty with press-up earlier this week due to mid-back pain.    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 Primary c/o low back  pain, L gluteal pain with LLE referral pattern. 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 Patient reports that symptoms were mild at initial onset. He reports that symptoms have worsened. He reports that it got to point that it was hard to bend over and tie his shoe. He reports going to [place] in 01/2020 and was being treated for tendonitis; failed prednisone taper. Pt reports flare up in late 2021 during which he had difficulty bending over to tie his shoes. He reports his leg "gave out completely" when climbing onto service truck. Pt reports several visits to orthopedist as well as one episode of physical therapy at [facility] office (PT in Nov-Dec 2021) and was treated for piriformis syndrome. Pt was informed about disc bulge in L5-S1 region with compression of nerve root. Pt was given option of back surgery or ESI - pt elected to undergo ESI. Hx of ESI x 3 in lumbar spine (he states that 3rd ESI did reduce his pain drastically). Pt reports significant difficulty with L knee extension. Pt reports no numbness or paresthesias; he reports consant throb affecting LLE. He states it recently hasn't been as sharp. Pt denies recent night pain. Pt denies changes in bathroom habits. Pt reports he did have LLE weakness that has since improved. Aggravated  by: sciatic nerve tensioner, bending, tying shoes. Alleviated by: lying down, most recent ESI. 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    Limitations Other (comment)    Diagnostic tests MRI L-spine: (per radiologist report) Disc desiccation with mild intervertebral disc space narrowing. Left subarticular disc protrusion with mild inferior  migration. Disc material impinges upon and displaces the descending  left S1 nerve root as it courses through the left lateral recess    Currently in Pain? No/denies    Pain Onset More than a month  ago             OBJECTIVE FINDINGS  AROM Shoulder flexion and abduction WFL with no pain Lumbar flexion WFL Lumbar extension WFL Lateral flexion: R WFL, L WFL Thoracolumbar rotation: R 75%*, L 100% (Mild R periscapular pain with R rotation)  Rib expansion WNL upper and lower with deep inhale   Accessory motion/PPIVM No reproduction of pain with CPA or UPA T3-T10  Palpation Tenderness to palpation along R rhomboid major, R longissimus thoracis T3-T5     Treatment performed   Therapeutic exercise - for soft tissue mobility and flexibility, lumbar spine ROM, graded loading  Cat Camel; x10  (no pain), quadruped Repeated extension in lying; 1x10, with patient overpressure     Patient education: HEP review, discussion on finishing POC with remaining visit       *not today* Piriformis stretch; 3x30sec seconds L side today Bird dog, 1x10 with 5 sec isometric hold; alternating with maintenance of neutral spine Seated sciatic glider; 2x10 Lower trunk rotation with QL bias (figure-4 position); x10 each LE Repeated extension in lying; 2x10, with clinician overpressure  Child's pose; x10, 5 sec     Neuromuscular Re-education - for improved motor control with functional movements e.g. hip hinging, trunk stability, lifting   Prone alternating opposite UE/LE (hip EXT, shoulder flexion); x10 alternating Dying bug; 2x10 with hips flexed 90 deg, alternating Nautilus Pallof press + forward elevation; 40-lb; 2x10 each direction Single-limb RDL with dowel; 2x10, bilat     *not today* Nautilus Deadlift with neutral spine; 1x10 [verbal cueing for maintenance of neutral spine with hip hinge]; 50-lb Physioball Bridge; 1x10, 5 sec hold; supine with Green physioball  Modified hip hinge with dowel; x10 Seated sciatic tensioner; x10, * fleeting reproduction of pain       Manual Therapy - for symptom modulation, soft tissue sensitivity and mobility, joint mobility, ROM    STM/DTM  to L gluteal mm, L piriformis STM and IASTM with Hypervolt to R rhomboid maj, middle and lower trapezius       Patient response to intervention: No lower quarter pain today. R periscapular pain from recent work incident that has improved with medical management. Improved R periscapular sensitivity following use of STM/Hypervolt    ASSESSMENT Patient is currently using naproxen and muscle relaxer for recent periscapular musculoskeletal injury. Clinical presentation consistent with muscular strain; mid-back/periscapular pain is mild today. He has significant tenderness to palpation at R rhomboid maj/middle trapezius. Patient tolerates manual therapy well along this region today with no complaint of pain following manual therapy and continued progressive loading today. Patient has made excellent progress and is approaching end-phase of therapy. Patient will benefit from continued skilled PT intervention to address residual deficits in fleeting pain and ability to perform work-related and self-care activities for best return to PLOF. Will gradually transition to independent home program at end of POC.  PT Short Term Goals - 11/25/20 0846       PT SHORT TERM GOAL #1   Title Patient will be indepedent and 100% compliant with established HEP and activity modification as needed to augment PT intervention and prevent flare-up of patient condition.    Baseline IE: No established home program, verbal cueing and demo for HEP performance; 11/25/2020: compliant, verbalizes established program    Time 2    Period Weeks    Status Achieved    Target Date 11/04/20      PT SHORT TERM GOAL #2   Title Patient will have full thoracolumbar AROM without reproduction of pain as needed for reaching items on ground, household chores, bending.    Baseline IE: Decreased lumbar flexion AROM, to patella at baseline; 11/25/2020: lumbar flexion WFL with intermittent pain with bending    Time 3    Period Weeks     Status Partially Met    Target Date 11/11/20      PT SHORT TERM GOAL #3   Title Patient will perform modified hip hinge with dowel demonstrating improved ability to complete bending/hinging task with neutral spine and without reproduction of pain indicative of improved tolerance of flexion-based tasks    Baseline IE: Pain with bending in standing; 11/25/2020: performed with progressive exercise at previous visit with sound hip hinge/neutral spine    Time 3    Period Weeks    Status Achieved    Target Date 11/11/20               PT Long Term Goals - 11/25/20 0752       PT LONG TERM GOAL #1   Title Patient will demonstrate improved function as evidenced by a score of 77 on FOTO measure for full participation in activities at home and in the community.    Baseline IE: FOTO 70; 11/25/2020: 77    Time 6    Period Weeks    Status Achieved    Target Date 12/03/20      PT LONG TERM GOAL #2   Title Patient will have no reproduction of pain with sciatic tensioner as needed for extending lower limb to operate foot controls on heavy machinery and complete self-care ADLs e.g. tying and donning footwear    Baseline IE: Inability to assume SLUMP position/sciatic tensioner; 11/25/2020: fleeting pain with sciatic tensioner in sitting, no pain with flossing without cervical spine flexion    Time 6    Period Weeks    Status Partially Met      PT LONG TERM GOAL #3   Title Patient will have hip and core strength (per Sahrmann scale) 5/5 indicative of excellent lower quarter strength required for performing heavy physical labor and operating heavy equipment at work    Baseline IE: Hip flexor strength decreased, decreased capacity for loading core/gluteal mm; 11/25/2020: Sahrmann scale core strength 4/5    Time 6    Period Weeks    Status Partially Met                   Plan - 12/03/20 0941     Clinical Impression Statement Patient is currently using naproxen and muscle relaxer for recent  periscapular musculoskeletal injury. Clinical presentation consistent with muscular strain; mid-back/periscapular pain is mild today. He has significant tenderness to palpation at R rhomboid maj/middle trapezius. Patient tolerates manual therapy well along this region today with no complaint of pain following manual therapy and continued progressive loading today. Patient has  made excellent progress and is approaching end-phase of therapy. Patient will benefit from continued skilled PT intervention to address residual deficits in fleeting pain and ability to perform work-related and self-care activities for best return to PLOF. Will gradually transition to independent home program at end of POC.    Personal Factors and Comorbidities Profession;Time since onset of injury/illness/exacerbation    Examination-Activity Limitations Bend;Dressing    Examination-Participation Restrictions Occupation    Stability/Clinical Decision Making Evolving/Moderate complexity    Rehab Potential Excellent    PT Frequency 2x / week    PT Duration 6 weeks    PT Treatment/Interventions Therapeutic activities;Therapeutic exercise;Manual techniques;Neuromuscular re-education;Dry needling;Spinal Manipulations;Joint Manipulations;Patient/family education    PT Next Visit Plan Ongoing extension principle management with progression of force per MDT guidelines as needed, currently utilizing patient overpressure. Graded movement/progressive loading and neurodynamics. Transition to HEP toward end of remaining visits.    PT Home Exercise Plan Access Code: [email or web address]  URL: [email or web address]  Date: 11/04/2020  Prepared by: Valentina Gu    Exercises  Prone Press Up - 5-6 x daily - 7 x weekly - 1 sets - 10 reps - 1sec hold  Supine Hamstring Stretch - 2 x daily - 7 x weekly - 2 sets - 10 reps - 1sec hold  Seated Slump Nerve Glide - 2 x daily - 7 x weekly - 2 sets - 10 reps - 1sec hold  Supine Piriformis Stretch with Foot on  Ground - 2 x daily - 7 x weekly - 3 sets - 30sec hold    Consulted and Agree with Plan of Care Patient             Patient will benefit from skilled therapeutic intervention in order to improve the following deficits and impairments:  Hypomobility, Pain, Improper body mechanics  Visit Diagnosis: Left-sided low back pain with left-sided sciatica, unspecified chronicity  Left hip pain     Problem List Patient Active Problem List   Diagnosis Date Noted   Colitis 11/16/2015   Abdominal pain 11/16/2015   Valentina Gu, PT, DPT [id number] Eilleen Kempf 12/03/2020, 9:47 AM  [place] [facility] [address] [place], [place], [address] Phone: [phone]   Fax:  [phone]  Name: Teagen Mcleary MRN: [id number] Date of Birth: Jun 03, 1990

## 2020-12-09 ENCOUNTER — Other Ambulatory Visit: Payer: Self-pay

## 2020-12-09 ENCOUNTER — Ambulatory Visit: Payer: BC Managed Care – PPO | Admitting: Physical Therapy

## 2020-12-09 DIAGNOSIS — M5442 Lumbago with sciatica, left side: Secondary | ICD-10-CM

## 2020-12-09 DIAGNOSIS — M25552 Pain in left hip: Secondary | ICD-10-CM

## 2020-12-09 NOTE — Therapy (Signed)
[facility] [address] [place], [place], [address] Phone: [phone]   Fax:  [phone]  Physical Therapy Treatment  Patient Details  Name: Brandon Walsh MRN: [id number] Date of Birth: 10-07-1990 Referring Provider (PT): Doyle Askew, MD   Encounter Date: 12/09/2020   PT End of Session - 12/09/20 0752     Visit Number 11    Number of Visits 13    Authorization Type BCBS Comm Pro: 30 visit scalendar year combined (PT/OT/chiro)    Authorization Time Period 10/21/20-12/02/20    Authorization - Visit Number 11    Authorization - Number of Visits --   Needs to be verified   Progress Note Due on Visit 13    PT Start Time 0730    PT Stop Time 0810    PT Time Calculation (min) 40 min    Activity Tolerance Patient tolerated treatment well;No increased pain    Behavior During Therapy [facility] for tasks assessed/performed             Past Medical History:  Diagnosis Date   Gastritis    GI bleed     Past Surgical History:  Procedure Laterality Date   COLONOSCOPY N/A 03/22/2016   Procedure: COLONOSCOPY;  Surgeon: Lollie Sails, MD;  Location: [facility] ENDOSCOPY;  Service: Endoscopy;  Laterality: N/A;   Foreign body removed 3rd distal phalanx Right 2020   NO PAST SURGERIES     wisdom teeth removal      There were no vitals filed for this visit.   Subjective Assessment - 12/09/20 0732     Subjective Patient reports that his symptoms are less than 1/10 at arrival to PT. He reports being able to complete sciatic nerve tensioner without notable pain. He reports some abdominal muscular soreness after last visit. He reports waking up without pain. Pt denies issue with sleep hygiene at this time. Patient reports 70-75% SANE score at this time. He reports he is primarily limited in playing basketball/sports. Patient reports symptoms can vary based on whether he has been bending a lot - he states that he does need to straigthen out his L  leg during driving.    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 Patient reports that symptoms were mild at initial onset. He reports that symptoms have worsened. He reports that it got to point that it was hard to bend over and tie his shoe. He reports going to [place] in 01/[place] and was being treated for tendonitis; failed prednisone taper. Pt reports flare up in late 2021 during which he had difficulty bending over to tie his shoes. He reports his leg "gave out completely" when climbing onto service truck. Pt reports several visits to orthopedist as well as one episode of physical therapy at [facility] office (PT in Nov-Dec 2021) and was treated for piriformis syndrome. Pt was informed about disc bulge in L5-S1 region with compression of nerve root. Pt was given option of back surgery or ESI - pt elected to undergo ESI. Hx of ESI x 3 in lumbar spine (he states that 3rd ESI did reduce his pain drastically). Pt reports significant difficulty with L knee extension. Pt reports no numbness or paresthesias; he reports consant throb affecting LLE. He states it  recently hasn't been as sharp. Pt denies recent night pain. Pt denies changes in bathroom habits. Pt reports he did have LLE weakness that has since improved. Aggravated by: sciatic nerve tensioner, bending, tying shoes. Alleviated by: lying down, most recent ESI. 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    Limitations Other (comment)    Diagnostic tests MRI L-spine: (per radiologist report) Disc desiccation with mild intervertebral disc space narrowing. Left subarticular disc protrusion with mild inferior  migration. Disc material impinges upon and displaces the descending  left  S1 nerve root as it courses through the left lateral recess    Pain Onset More than a month ago                OBJECTIVE FINDINGS  AROM Lumbar flexion 100% Lumbar extension 100% Lateral flexion: R 100%, L 100% Thoracolumbar rotation: R 100%, L 100%  Strength Core strength (Sahrmann scale): 5/5  Flexibility Hamstrings 90/90: R 20 deg, L 20 deg Ely's: R Negative, L Negative      Treatment performed  Therapeutic Activity -Re-assessment performed (see Objective Findings above)  Patient education: discussion on continued HEP, maintenance phase MDT protocol. Provided updated handout for home exercise program    Therapeutic exercise - for soft tissue mobility and flexibility, lumbar spine ROM, graded loading   Bird dog, 1x10 with 5 sec isometric hold; alternating with maintenance of neutral spine     *not today* Cat Camel; x10  (no pain), quadruped Repeated extension in lying; 1x10, with patient overpressure  Piriformis stretch; 3x30sec seconds L side today  Seated sciatic glider; 2x10 Lower trunk rotation with QL bias (figure-4 position); x10 each LE Repeated extension in lying; 2x10, with clinician overpressure  Child's pose; x10, 5 sec     Neuromuscular Re-education - for improved motor control with functional movements e.g. hip hinging, trunk stability, lifting   Prone alternating opposite UE/LE (hip EXT, shoulder flexion); 2x10 alternating Dying bug; *demo performed only* with hips flexed 90 deg, alternating Nautilus Pallof press + forward elevation; 50-lb; 2x10 each direction Single-limb RDL with dowel; 2x10, bilat Seated sciatic tensioner; x10, * fleeting reproduction of pain   *not today* Nautilus Deadlift with neutral spine; 1x10 [verbal cueing for maintenance of neutral spine with hip hinge]; 50-lb Physioball Bridge; 1x10, 5 sec hold; supine with Green physioball  Modified hip hinge with dowel; x10       Patient response to intervention: No  lower quarter pain today. Fleeting pain with sciatic n. tensioner that is not present after first repetition     ASSESSMENT Patient's periscapular pain has resolved following medical management, time removed from initial injury, and relative rest over the previous week. Patient reports minimal L lower quarter pain at this time and feels he is managing his condition well. He has met all established goals and is appropriate for transition to IND home program. Current plan to transition to independent home program at end of POC. Will D/C following today's visit.     PT Short Term Goals - 12/09/20 0754       PT SHORT TERM GOAL #1   Title Patient will be indepedent and 100% compliant with established HEP and activity modification as needed to augment PT intervention and prevent flare-up of patient condition.    Baseline IE: No established home program, verbal cueing and demo for HEP performance; 11/25/2020: compliant, verbalizes established program    Time 2    Period Weeks  Status Achieved    Target Date 11/04/20      PT SHORT TERM GOAL #2   Title Patient will have full thoracolumbar AROM without reproduction of pain as needed for reaching items on ground, household chores, bending.    Baseline IE: Decreased lumbar flexion AROM, to patella at baseline; 11/25/2020: lumbar flexion WFL with intermittent pain with bending. 12/09/20: full AROM all planes without pain    Time 3    Period Weeks    Status Achieved    Target Date 11/11/20      PT SHORT TERM GOAL #3   Title Patient will perform modified hip hinge with dowel demonstrating improved ability to complete bending/hinging task with neutral spine and without reproduction of pain indicative of improved tolerance of flexion-based tasks    Baseline IE: Pain with bending in standing; 11/25/2020: performed with progressive exercise at previous visit with sound hip hinge/neutral spine    Time 3    Period Weeks    Status Achieved    Target Date  11/11/20               PT Long Term Goals - 12/09/20 0759       PT LONG TERM GOAL #1   Title Patient will demonstrate improved function as evidenced by a score of 77 on FOTO measure for full participation in activities at home and in the community.    Baseline IE: FOTO 70; 11/25/2020: 77    Time 6    Period Weeks    Status Achieved    Target Date 12/03/20      PT LONG TERM GOAL #2   Title Patient will have no reproduction of pain with sciatic tensioner as needed for extending lower limb to operate foot controls on heavy machinery and complete self-care ADLs e.g. tying and donning footwear    Baseline IE: Inability to assume SLUMP position/sciatic tensioner; 11/25/2020: fleeting pain with sciatic tensioner in sitting, no pain with flossing without cervical spine flexion. 12/09/20:    Time 6    Period Weeks    Status Achieved    Target Date 12/03/20      PT LONG TERM GOAL #3   Title Patient will have hip and core strength (per Sahrmann scale) 5/5 indicative of excellent lower quarter strength required for performing heavy physical labor and operating heavy equipment at work    Baseline IE: Hip flexor strength decreased, decreased capacity for loading core/gluteal mm; 11/25/2020: Sahrmann scale core strength 4/5. 12/09/20: Sahrmann scale core strength 5/5    Time 6    Period Weeks    Status Achieved    Target Date 12/03/20                   Plan - 12/09/20 0833     Clinical Impression Statement Patient's periscapular pain has resolved following medical management, time removed from initial injury, and relative rest over the previous week. Patient reports minimal L lower quarter pain at this time and feels he is managing his condition well. He has met all established goals and is appropriate for transition to IND home program. Current plan to transition to independent home program at end of POC. Will D/C following today's visit.    Personal Factors and Comorbidities  Profession;Time since onset of injury/illness/exacerbation    Examination-Activity Limitations Bend;Dressing    Examination-Participation Restrictions Occupation    Stability/Clinical Decision Making Evolving/Moderate complexity    Rehab Potential Excellent    PT Frequency 2x / week  PT Duration 6 weeks    PT Treatment/Interventions Therapeutic activities;Therapeutic exercise;Manual techniques;Neuromuscular re-education;Dry needling;Spinal Manipulations;Joint Manipulations;Patient/family education    PT Next Visit Plan Ongoing extension principle management with progression of force per MDT guidelines as needed, currently utilizing patient overpressure. Graded movement/progressive loading and neurodynamics. Transition to HEP at this time and D/C current case.    PT Home Exercise Plan Access Code: [email or web address]    Consulted and Agree with Plan of Care Patient             Patient will benefit from skilled therapeutic intervention in order to improve the following deficits and impairments:  Hypomobility, Pain, Improper body mechanics  Visit Diagnosis: Left-sided low back pain with left-sided sciatica, unspecified chronicity  Left hip pain     Problem List Patient Active Problem List   Diagnosis Date Noted   Colitis 11/16/2015   Abdominal pain 11/16/2015   Valentina Gu, PT, DPT [phone] Eilleen Kempf 12/09/2020, 8:55 AM  [facility] [address] [place], [place], [address] Phone: [phone]   Fax:  [phone]  Name: Brandon Walsh MRN: [id number] Date of Birth: 01-03-1991

## 2021-05-18 ENCOUNTER — Ambulatory Visit
Admission: EM | Admit: 2021-05-18 | Discharge: 2021-05-18 | Disposition: A | Discharge status: Home / Self Care | Payer: BC Managed Care – PPO | Attending: Emergency Medicine | Admitting: Emergency Medicine

## 2021-05-18 ENCOUNTER — Other Ambulatory Visit: Payer: Self-pay

## 2021-05-18 DIAGNOSIS — J069 Acute upper respiratory infection, unspecified: Secondary | ICD-10-CM | POA: Diagnosis not present

## 2021-05-18 MED ORDER — PROMETHAZINE-DM 6.25-15 MG/5ML PO SYRP: 5.0000 mL | ORAL_SOLUTION | Freq: Four times a day (QID) | ORAL | 0 refills | Status: AC | PRN

## 2021-05-18 MED ORDER — IPRATROPIUM BROMIDE 0.06 % NA SOLN: 2.0000 | Freq: Four times a day (QID) | NASAL | 12 refills | Status: AC

## 2021-05-18 MED ORDER — BENZONATATE 100 MG PO CAPS: 200.0000 mg | ORAL_CAPSULE | Freq: Three times a day (TID) | ORAL | 0 refills | Status: AC

## 2021-05-18 NOTE — ED Provider Notes (Signed)
[facility]    CSN: [id number] Arrival date & time: 05/18/21  [id number]      History   Chief Complaint Chief Complaint  Patient presents with   Nasal Congestion   Sore Throat   Cough    HPI Brandon Walsh is a 30 y.o. male.   HPI  30 year old male here for evaluation of respiratory complaints.  Patient reports that for the last 6 days he has been experiencing nasal congestion with green/bloody/clear nasal discharge, sore throat, body aches, and a cough that is productive for green mucus.  The cough intensifies at night when he lays down.  He reports that he had a fever for the first 2 days of symptoms but he has not had a fever for the last 4 days.  His T-max was 100.7.  He also denies ear pain, shortness of breath or wheezing, or GI complaints.  Past Medical History:  Diagnosis Date   Gastritis    GI bleed     Patient Active Problem List   Diagnosis Date Noted   Colitis 11/16/2015   Abdominal pain 11/16/2015    Past Surgical History:  Procedure Laterality Date   COLONOSCOPY N/A 03/22/2016   Procedure: COLONOSCOPY;  Surgeon: Lollie Sails, MD;  Location: [facility] ENDOSCOPY;  Service: Endoscopy;  Laterality: N/A;   Foreign body removed 3rd distal phalanx Right 2020   NO PAST SURGERIES     wisdom teeth removal         Home Medications    Prior to Admission medications   Medication Sig Start Date End Date Taking? Authorizing Provider  benzonatate (TESSALON) 100 MG capsule Take 2 capsules (200 mg total) by mouth every 8 (eight) hours. 05/18/21  Yes Margarette Canada, NP  ipratropium (ATROVENT) 0.06 % nasal spray Place 2 sprays into both nostrils 4 (four) times daily. 05/18/21  Yes Margarette Canada, NP  promethazine-dextromethorphan (PROMETHAZINE-DM) 6.25-15 MG/5ML syrup Take 5 mLs by mouth 4 (four) times daily as needed. 05/18/21  Yes Margarette Canada, NP  acetaminophen (TYLENOL) 325 MG tablet Take 650 mg by mouth every 6 (six) hours as needed for headache (for  headaches).    [provider]  Cholecalciferol 50 MCG (2000 UT) CAPS Take 1 capsule by mouth 3 x daily with food. 1 pill TID x  months, then reduce to 1 pill QD thereafter    [provider]  naproxen (NAPROSYN) 500 MG tablet Take 500 mg by mouth 2 (two) times daily with a meal. As needed Patient not taking: Reported on 10/21/2020    Reche Dixon, PA-C  naproxen sodium (ANAPROX) 275 MG tablet Take 275 mg by mouth as needed. Patient not taking: Reported on 10/21/2020    [provider]  predniSONE (DELTASONE) 10 MG tablet Take 10 mg by mouth daily with breakfast. 21 pills for a 6 day taper    [provider]  tiZANidine (ZANAFLEX) 2 MG tablet Take by mouth every 6 (six) hours as needed for muscle spasms. Patient not taking: Reported on 10/21/2020    Reche Dixon, PA-C  traMADol (ULTRAM) 50 MG tablet Take 50 mg by mouth every 6 (six) hours as needed for moderate pain. Patient not taking: Reported on 10/21/2020    Reche Dixon, PA-C    Family History History reviewed. No pertinent family history.  Social History Social History   Tobacco Use   Smoking status: Former    Packs/day: 1.00    Years: 10.00    Pack years: 10.00  Types: Cigarettes    Quit date: 02/21/2016    Years since quitting: 5.2   Smokeless tobacco: Never  Substance Use Topics   Alcohol use: Yes    Alcohol/week: 2.0 standard drinks    Types: 2 Cans of beer per week   Drug use: No     Allergies   Patient has no known allergies.   Review of Systems Review of Systems  Constitutional:  Positive for fever. Negative for activity change and appetite change.  HENT:  Positive for congestion, postnasal drip, rhinorrhea and sore throat. Negative for ear pain.   Respiratory:  Positive for cough. Negative for shortness of breath and wheezing.   Gastrointestinal:  Negative for diarrhea, nausea and vomiting.  Musculoskeletal:  Positive for arthralgias and myalgias.  Skin:  Negative for rash.   Hematological: Negative.   Psychiatric/Behavioral: Negative.      Physical Exam Triage Vital Signs ED Triage Vitals  Enc Vitals Group     BP 05/18/21 0931 109/71     Pulse Rate 05/18/21 0931 83     Resp 05/18/21 0931 16     Temp 05/18/21 0931 97.7 F (36.5 C)     Temp Source 05/18/21 0931 Oral     SpO2 05/18/21 0931 99 %     Weight --      Height --      Head Circumference --      Peak Flow --      Pain Score 05/18/21 0930 5     Pain Loc --      Pain Edu? --      Excl. in GC? --    No data found.  Updated Vital Signs BP 109/71 (BP Location: Right Arm)    Pulse 83    Temp 97.7 F (36.5 C) (Oral)    Resp 16    SpO2 99%   Visual Acuity Right Eye Distance:   Left Eye Distance:   Bilateral Distance:    Right Eye Near:   Left Eye Near:    Bilateral Near:     Physical Exam Vitals and nursing note reviewed.  Constitutional:      General: He is not in acute distress.    Appearance: Normal appearance. He is normal weight. He is not ill-appearing.  HENT:     Head: Normocephalic and atraumatic.     Right Ear: Tympanic membrane, ear canal and external ear normal. There is no impacted cerumen.     Left Ear: Tympanic membrane, ear canal and external ear normal. There is no impacted cerumen.     Nose: Congestion and rhinorrhea present.     Mouth/Throat:     Mouth: Mucous membranes are moist.     Pharynx: Oropharynx is clear. Posterior oropharyngeal erythema present.  Cardiovascular:     Rate and Rhythm: Normal rate and regular rhythm.     Pulses: Normal pulses.     Heart sounds: Normal heart sounds. No murmur heard.   No gallop.  Pulmonary:     Effort: Pulmonary effort is normal.     Breath sounds: Normal breath sounds. No wheezing, rhonchi or rales.  Musculoskeletal:     Cervical back: Normal range of motion and neck supple.  Lymphadenopathy:     Cervical: No cervical adenopathy.  Skin:    General: Skin is warm and dry.     Capillary Refill: Capillary refill takes  less than 2 seconds.     Findings: No erythema or rash.  Neurological:  General: No focal deficit present.     Mental Status: He is alert and oriented to person, place, and time.  Psychiatric:        Mood and Affect: Mood normal.        Behavior: Behavior normal.        Thought Content: Thought content normal.        Judgment: Judgment normal.     UC Treatments / Results  Labs (all labs ordered are listed, but only abnormal results are displayed) Labs Reviewed - No data to display  EKG   Radiology No results found.  Procedures Procedures (including critical care time)  Medications Ordered in UC Medications - No data to display  Initial Impression / Assessment and Plan / UC Course  I have reviewed the triage vital signs and the nursing notes.  Pertinent labs & imaging results that were available during my care of the patient were reviewed by me and considered in my medical decision making (see chart for details).  Is a nontoxic-appearing 30 year old male here for evaluation of respiratory complaints as outlined HPI above.  Patient's physical exam reveals pearly gray tympanic membranes bilaterally with normal light reflex and clear external auditory canals.  Nasal mucosa is erythematous and edematous with clear discharge in both nares.  Oropharyngeal exam reveals mild posterior oropharyngeal erythema and injection with clear postnasal drip.  No cervical lymphadenopathy appreciated exam.  Cardiopulmonary exam reveals clear lung sounds in all fields.  Patient exam is consistent with a viral URI with a cough.  We will treat him symptomatically with Atrovent nasal spray to up the nasal congestion, Tessalon Perles to help with cough during the day, and Promethazine DM cough syrup to help with cough at nighttime.  Work note provided.   Final Clinical Impressions(s) / UC Diagnoses   Final diagnoses:  Viral URI with cough     Discharge Instructions      Use the Atrovent nasal  spray, 2 squirts in each nostril every 6 hours, as needed for runny nose and postnasal drip.  Use the Tessalon Perles every 8 hours during the day.  Take them with a small sip of water.  They may give you some numbness to the base of your tongue or a metallic taste in your mouth, this is normal.  Use the Promethazine DM cough syrup at bedtime for cough and congestion.  It will make you drowsy so do not take it during the day.  Return for reevaluation or see your primary care provider for any new or worsening symptoms.      ED Prescriptions     Medication Sig Dispense Auth. Provider   benzonatate (TESSALON) 100 MG capsule Take 2 capsules (200 mg total) by mouth every 8 (eight) hours. 21 capsule Margarette Canada, NP   ipratropium (ATROVENT) 0.06 % nasal spray Place 2 sprays into both nostrils 4 (four) times daily. 15 mL Margarette Canada, NP   promethazine-dextromethorphan (PROMETHAZINE-DM) 6.25-15 MG/5ML syrup Take 5 mLs by mouth 4 (four) times daily as needed. 118 mL Margarette Canada, NP      PDMP not reviewed this encounter.   Margarette Canada, NP 05/18/21 [phone]

## 2021-05-18 NOTE — Discharge Instructions (Signed)

## 2021-05-18 NOTE — ED Triage Notes (Signed)
Patient presents to Urgent Care with complaints of body aches, nasal congestion, sore throat, and cough since last Tuesday. Treating symptoms with advil sinus congestion, mucinex, and nyquil. Last known fever 12/22.  Denies fever.

## 2021-09-20 ENCOUNTER — Ambulatory Visit
Admission: EM | Admit: 2021-09-20 | Discharge: 2021-09-20 | Disposition: A | Discharge status: Home / Self Care | Payer: BC Managed Care – PPO | Attending: Physician Assistant | Admitting: Physician Assistant

## 2021-09-20 ENCOUNTER — Other Ambulatory Visit: Payer: Self-pay

## 2021-09-20 DIAGNOSIS — R509 Fever, unspecified: Secondary | ICD-10-CM | POA: Diagnosis not present

## 2021-09-20 DIAGNOSIS — R5383 Other fatigue: Secondary | ICD-10-CM | POA: Diagnosis not present

## 2021-09-20 DIAGNOSIS — J039 Acute tonsillitis, unspecified: Secondary | ICD-10-CM | POA: Diagnosis not present

## 2021-09-20 DIAGNOSIS — Z20822 Contact with and (suspected) exposure to covid-19: Secondary | ICD-10-CM | POA: Insufficient documentation

## 2021-09-20 LAB — RESP PANEL BY RT-PCR (FLU A&B, COVID) ARPGX2
Influenza A by PCR: NEGATIVE
Influenza B by PCR: NEGATIVE
SARS Coronavirus 2 by RT PCR: NEGATIVE

## 2021-09-20 LAB — GROUP A STREP BY PCR: Group A Strep by PCR: NOT DETECTED

## 2021-09-20 MED ORDER — AMOXICILLIN-POT CLAVULANATE 875-125 MG PO TABS: 1.0000 | ORAL_TABLET | Freq: Two times a day (BID) | ORAL | 0 refills | Status: AC

## 2021-09-20 NOTE — ED Provider Notes (Signed)
?[facility] ? ? ? ?CSN: [id number] ?Arrival date & time: 09/20/21  1433 ? ? ?  ? ?History   ?Chief Complaint ?No chief complaint on file. ? ? ?HPI ?Brandon Walsh is a 31 y.o. male presenting for fever of up to 101 degrees, fatigue, aches, chills, chest pain when taking a deep breath.  Symptom onset 3-4 days ago.  Reports reduced appetite.  Denies cough, sore throat, nasal congestion, breathing difficulty, vomiting or diarrhea.  No abdominal pain, urinary symptoms.  No sick contacts.  Took a COVID test at home and reports that it was negative.  Patient has been taking ibuprofen and Tylenol for his fever.  Temp currently 99.8 degrees.  No other complaints today. ? ?HPI ? ?Past Medical History:  ?Diagnosis Date  ? Gastritis   ? GI bleed   ? ? ?Patient Active Problem List  ? Diagnosis Date Noted  ? Colitis 11/16/2015  ? Abdominal pain 11/16/2015  ? ? ?Past Surgical History:  ?Procedure Laterality Date  ? COLONOSCOPY N/A 03/22/2016  ? Procedure: COLONOSCOPY;  Surgeon: [name] U Skulskie, MD;  Location[facility] END[facility]COPY;  Service: Endoscopy;  Laterality: N/A;  ? Foreign body removed 3rd distal phalanx Right 2020  ? NO PAST SURGERIES    ? wisdom teeth removal    ? ? ? ? ? ?Home Medications   ? ?Prior to Admission medications   ?Medication Sig Start Date End Date Taking? Authorizing Provider  ?amoxicillin-clavulanate (AUGMENTIN) 875-125 MG tablet Take 1 tablet by mouth every 12 (twelve) hours for 10 days. 09/20/21 09/30/21 Yes Shirlee LatchEaves, Syre Knerr B, PA-C  ?acetaminophen (TYLENOL) 325 MG tablet Take 650 mg by mouth every 6 (six) hours as needed for headache (for headaches).    [provider]  ?benzonatate (TESSALON) 100 MG capsule Take 2 capsules (200 mg total) by mouth every 8 (eight) hours. 05/18/21   Becky Augustayan, Jeremy, NP  ?Cholecalciferol 50 MCG (2000 UT) CAPS Take 1 capsule by mouth 3 x daily with food. 1 pill TID x  months, then reduce to 1 pill QD thereafter    [provider]  ?ipratropium (ATROVENT) 0.06  % nasal spray Place 2 sprays into both nostrils 4 (four) times daily. 05/18/21   Becky Augustayan, Jeremy, NP  ?naproxen (NAPROSYN) 500 MG tablet Take 500 mg by mouth 2 (two) times daily with a meal. As needed ?Patient not taking: Reported on 10/21/2020    Dedra SkeensMundy, Todd, PA-C  ?naproxen sodium (ANAPROX) 275 MG tablet Take 275 mg by mouth as needed. ?Patient not taking: Reported on 10/21/2020    [provider]  ?predniSONE (DELTASONE) 10 MG tablet Take 10 mg by mouth daily with breakfast. 21 pills for a 6 day taper    [provider]  ?promethazine-dextromethorphan (PROMETHAZINE-DM) 6.25-15 MG/5ML syrup Take 5 mLs by mouth 4 (four) times daily as needed. 05/18/21   Becky Augustayan, Jeremy, NP  ?tiZANidine (ZANAFLEX) 2 MG tablet Take by mouth every 6 (six) hours as needed for muscle spasms. ?Patient not taking: Reported on 10/21/2020    Dedra SkeensMundy, Todd, PA-C  ?traMADol (ULTRAM) 50 MG tablet Take 50 mg by mouth every 6 (six) hours as needed for moderate pain. ?Patient not taking: Reported on 10/21/2020    Dedra SkeensMundy, Todd, PA-C  ? ? ?Family History ?History reviewed. No pertinent family history. ? ?Social History ?Social History  ? ?Tobacco Use  ? Smoking status: Former  ?  Packs/day: 1.00  ?  Years: 10.00  ?  Pack years: 10.00  ?  Types: Cigarettes  ?  Quit date: 02/21/2016  ?  Years since quitting: 5.5  ? Smokeless tobacco: Never  ?Substance Use Topics  ? Alcohol use: Yes  ?  Alcohol/week: 2.0 standard drinks  ?  Types: 2 Cans of beer per week  ? Drug use: No  ? ? ? ?Allergies   ?Patient has no known allergies. ? ? ?Review of Systems ?Review of Systems  ?Constitutional:  Positive for chills, fatigue and fever.  ?HENT:  Negative for congestion, rhinorrhea, sinus pressure, sinus pain and sore throat.   ?Respiratory:  Negative for cough and shortness of breath.   ?Cardiovascular:  Positive for chest pain (chest ache when taking a breath).  ?Gastrointestinal:  Negative for abdominal pain, diarrhea, nausea and vomiting.  ?Musculoskeletal:   Positive for myalgias.  ?Neurological:  Positive for headaches. Negative for weakness and light-headedness.  ?Hematological:  Negative for adenopathy.  ? ? ?Physical Exam ?Triage Vital Signs ?ED Triage Vitals  ?Enc Vitals Group  ?   BP 09/20/21 1453 120/77  ?   Pulse Rate 09/20/21 1453 96  ?   Resp 09/20/21 1453 20  ?   Temp 09/20/21 1453 99.8 ?F (37.7 ?C)  ?   Temp Source 09/20/21 1453 Oral  ?   SpO2 09/20/21 1453 96 %  ?   Weight 09/20/21 1451 190 lb (86.2 kg)  ?   Height 09/20/21 1451 6' (1.829 m)  ?   Head Circumference --   ?   Peak Flow --   ?   Pain Score 09/20/21 1451 0  ?   Pain Loc --   ?   Pain Edu? --   ?   Excl. in GC? --   ? ?No data found. ? ?Updated Vital Signs ?BP 120/77 (BP Location: Left Arm)   Pulse 96   Temp 99.8 ?F (37.7 ?C) (Oral)   Resp 20   Ht 6' (1.829 m)   Wt 190 lb (86.2 kg)   SpO2 96%   BMI 25.77 kg/m?  ? ?   ? ?Physical Exam ?Vitals and nursing note reviewed.  ?Constitutional:   ?   General: He is not in acute distress. ?   Appearance: Normal appearance. He is well-developed. He is ill-appearing.  ?HENT:  ?   Head: Normocephalic and atraumatic.  ?   Nose: Nose normal.  ?   Mouth/Throat:  ?   Mouth: Mucous membranes are moist.  ?   Pharynx: Oropharyngeal exudate (whitish exudates right tonsil) and posterior oropharyngeal erythema present.  ?Eyes:  ?   General: No scleral icterus. ?   Conjunctiva/sclera: Conjunctivae normal.  ?Cardiovascular:  ?   Rate and Rhythm: Normal rate and regular rhythm.  ?   Heart sounds: Normal heart sounds.  ?Pulmonary:  ?   Effort: Pulmonary effort is normal. No respiratory distress.  ?   Breath sounds: Normal breath sounds.  ?Musculoskeletal:  ?   Cervical back: Neck supple.  ?Lymphadenopathy:  ?   Cervical: Cervical adenopathy present.  ?Skin: ?   General: Skin is warm and dry.  ?   Capillary Refill: Capillary refill takes less than 2 seconds.  ?Neurological:  ?   General: No focal deficit present.  ?   Mental Status: He is alert. Mental status is at  baseline.  ?   Motor: No weakness.  ?   Gait: Gait normal.  ?Psychiatric:     ?   Mood and Affect: Mood normal.     ?   Behavior: Behavior normal.     ?  Thought Content: Thought content normal.  ? ? ? ?UC Treatments / Results  ?Labs ?(all labs ordered are listed, but only abnormal results are displayed) ?Labs Reviewed  ?RESP PANEL BY RT-PCR (FLU A&B, COVID) [id number]  ?GROUP A STREP BY PCR  ? ? ?EKG ? ? ?Radiology ?No results found. ? ?Procedures ?Procedures (including critical care time) ? ?Medications Ordered in UC ?Medications - No data to display ? ?Initial Impression / Assessment and Plan / UC Course  ?I have reviewed the triage vital signs and the nursing notes. ? ?Pertinent labs & imaging results that were available during my care of the patient were reviewed by me and considered in my medical decision making (see chart for details). ? ?31 year old male presenting for fever, fatigue, body aches, headaches, chest pain when taking a deep breath.  Symptom onset about 4 days ago.  Vitals are stable.  He is ill-appearing and appears to be very fatigued.  Exam significant for erythema posterior pharynx with white thick exudates on the right tonsil, tender and enlarged bilateral anterior cervical lymph nodes.  Chest clear to auscultation and heart regular rate rhythm.  No abdominal tenderness. ? ?PCR strep test obtained as well as PCR respiratory panel.  All negative. ? ?Discussed all results with patient.  Advised him that I am going to treat him for acute tonsillitis with antibiotics.  Sent Augmentin to pharmacy.  Encouraged increasing rest and fluids.  Advised bland foods but to really make sure he is drinking plenty of fluids.  Advise going to emergency department for any acute worsening of symptoms or if he is not breaking the fever in the next couple of days or he develops new symptoms including breathing difficulty, abdominal pain, vomiting, etc.  Patient agrees to plan. ? ? ?Final Clinical Impressions(s) / UC  Diagnoses  ? ?Final diagnoses:  ?Acute tonsillitis, unspecified etiology  ?Fever, unspecified  ?Other fatigue  ? ? ? ?Discharge Instructions   ? ?  ?-Negative flu and COVID ?-Negative strep test but I am co

## 2021-09-20 NOTE — ED Triage Notes (Signed)
Patient c.o a consistent fever since Thursday -- has not been getting better. Patient is also having headaches, body aches and chills.  ? ?Patient stated it was hard rio take a deep breath and that " his lungs hurt" ? ?@ home COVID test was negative.  ?

## 2021-09-20 NOTE — Discharge Instructions (Addendum)
-  Negative flu and COVID ?-Negative strep test but I am concerned about tonsillitis.  This could explain your symptoms.  I have sent antibiotics to pharmacy. ?- Increase rest and fluid intake. ?- Smaller meals. ?- Go to emergency department if not breaking the fever in the next 2 days or new or worsening symptoms including abdominal pain, vomiting, breathing difficulty, increased chest pain or weakness. ?

## 2021-11-09 ENCOUNTER — Other Ambulatory Visit: Payer: Self-pay

## 2021-11-09 DIAGNOSIS — Z203 Contact with and (suspected) exposure to rabies: Secondary | ICD-10-CM | POA: Insufficient documentation

## 2021-11-09 DIAGNOSIS — S61431A Puncture wound without foreign body of right hand, initial encounter: Secondary | ICD-10-CM | POA: Insufficient documentation

## 2021-11-09 DIAGNOSIS — Z2914 Encounter for prophylactic rabies immune globin: Secondary | ICD-10-CM | POA: Insufficient documentation

## 2021-11-09 DIAGNOSIS — S41132A Puncture wound without foreign body of left upper arm, initial encounter: Secondary | ICD-10-CM | POA: Diagnosis not present

## 2021-11-09 DIAGNOSIS — S41131A Puncture wound without foreign body of right upper arm, initial encounter: Secondary | ICD-10-CM | POA: Diagnosis present

## 2021-11-09 DIAGNOSIS — Z23 Encounter for immunization: Secondary | ICD-10-CM | POA: Diagnosis not present

## 2021-11-09 DIAGNOSIS — S61432A Puncture wound without foreign body of left hand, initial encounter: Secondary | ICD-10-CM | POA: Diagnosis not present

## 2021-11-09 DIAGNOSIS — W5501XA Bitten by cat, initial encounter: Secondary | ICD-10-CM | POA: Diagnosis not present

## 2021-11-09 NOTE — ED Triage Notes (Signed)
Pt presents to ER from home c/o cat bite that happened tonight.  Pt states he went to look for cat that bit his daughter on Saturday, and found it.  Pt says the cat then attacked him, biting and clawing him.  Pt has scratch and bite marks to BIL arms w/bleeding controlled at this time.  Unknown rabies status of cat.  Pt states he has reported cat to animal control.  Pt alert and in NAD at this time.

## 2021-11-10 ENCOUNTER — Emergency Department
Admission: EM | Admit: 2021-11-10 | Discharge: 2021-11-10 | Disposition: A | Discharge status: Home / Self Care | Payer: BC Managed Care – PPO | Attending: Emergency Medicine | Admitting: Emergency Medicine

## 2021-11-10 DIAGNOSIS — W5501XA Bitten by cat, initial encounter: Secondary | ICD-10-CM

## 2021-11-10 MED ORDER — RABIES IMMUNE GLOBULIN 150 UNIT/ML IM INJ
20.0000 [IU]/kg | INJECTION | Freq: Once | INTRAMUSCULAR | Status: AC
Start: 2021-11-10 — End: 2021-11-10
  Administered 2021-11-10: 1725 [IU] via INTRAMUSCULAR
  Filled 2021-11-10: qty 20

## 2021-11-10 MED ORDER — AMOXICILLIN-POT CLAVULANATE 875-125 MG PO TABS: 1.0000 | ORAL_TABLET | Freq: Two times a day (BID) | ORAL | 0 refills | Status: AC

## 2021-11-10 MED ORDER — RABIES VACCINE, PCEC IM SUSR
1.0000 mL | Freq: Once | INTRAMUSCULAR | Status: AC
  Administered 2021-11-10: 1 mL via INTRAMUSCULAR
  Filled 2021-11-10: qty 1

## 2021-11-10 MED ORDER — AMOXICILLIN-POT CLAVULANATE 875-125 MG PO TABS
1.0000 | ORAL_TABLET | Freq: Once | ORAL | Status: AC
  Administered 2021-11-10: 1 via ORAL
  Filled 2021-11-10: qty 1

## 2021-11-10 NOTE — ED Provider Notes (Signed)
[facility] Provider Note    Event Date/Time   First MD Initiated Contact with Patient 11/10/21 [phone]     (approximate)   History   Animal Bite   HPI  Brandon Walsh is a 31 y.o. male who presents for evaluation after a stray cat bite.  Patient's daughter was bitten by the cat 2 days ago.  Patient has been trying to work with animal control to capture the cat but nobody can has come out to look for the cat.  Tonight he decided to go look for the cat.  He found the cat but the cat attacked him.  Patient has dozens of scratch and puncture bites to both arms.  Tetanus shot is up-to-date.  No priors rabies vaccination.  The cat is stray.  He did wash with surgical scrub at home that was provided by his brother who is a International aid/development worker.     Past Medical History:  Diagnosis Date   Gastritis    GI bleed     Past Surgical History:  Procedure Laterality Date   COLONOSCOPY N/A 03/22/2016   Procedure: COLONOSCOPY;  Surgeon: Christena Deem, MD;  Location: [facility] ENDOSCOPY;  Service: Endoscopy;  Laterality: N/A;   Foreign body removed 3rd distal phalanx Right 2020   NO PAST SURGERIES     wisdom teeth removal       Physical Exam   Triage Vital Signs: ED Triage Vitals [11/09/21 2245]  Enc Vitals Group     BP 123/87     Pulse Rate 80     Resp 18     Temp (!) 97.5 F (36.4 C)     Temp Source Oral     SpO2 99 %     Weight 190 lb (86.2 kg)     Height 6' (1.829 m)     Head Circumference      Peak Flow      Pain Score 2     Pain Loc      Pain Edu?      Excl. in GC?     Most recent vital signs: Vitals:   11/09/21 2245  BP: 123/87  Pulse: 80  Resp: 18  Temp: (!) 97.5 F (36.4 C)  SpO2: 99%     Constitutional: Alert and oriented. Well appearing and in no apparent distress. HEENT:      Head: Normocephalic and atraumatic.         Eyes: Conjunctivae are normal. Sclera is non-icteric.       Mouth/Throat: Mucous membranes are moist.       Neck: Supple  with no signs of meningismus. Cardiovascular: Regular rate and rhythm. Respiratory: Normal respiratory effort.  Musculoskeletal: Several scratches and puncture bite wounds to bilateral arms and hands, no open laceration  neurologic: Normal speech and language. Face is symmetric. Moving all extremities. No gross focal neurologic deficits are appreciated. Skin: Skin is warm, dry and intact. No rash noted. Psychiatric: Mood and affect are normal. Speech and behavior are normal.  ED Results / Procedures / Treatments   Labs (all labs ordered are listed, but only abnormal results are displayed) Labs Reviewed - No data to display   EKG  none   RADIOLOGY none   PROCEDURES:  Critical Care performed: No  Procedures    IMPRESSION / MDM / ASSESSMENT AND PLAN / ED COURSE  I reviewed the triage vital signs and the nursing notes.  31 y.o. male who presents for evaluation after a stray  cat bite.  Patient has dozens of scratch marks and puncture bites in bilateral arms and hands from a stray cat.  Tetanus shot is up-to-date.  Patient was given rabies vaccine and immunoglobulin's.  Patient washed the wounds at home with surgical scrub.  We discussed wound care.  Patient started on Augmentin.  Patient will be referred to Dr. Stephenie Acres on [facility] for close monitoring of the wounds in his hand in case they become infected   MEDICATIONS GIVEN IN ED: Medications  rabies vaccine (RABAVERT) injection 1 mL (1 mL Intramuscular Given 11/10/21 0252)  rabies immune globulin (HYPERAB/KEDRAB) injection 1,725 Units (1,725 Units Intramuscular Given 11/10/21 0358)  amoxicillin-clavulanate (AUGMENTIN) 875-125 MG per tablet 1 tablet (1 tablet Oral Given 11/10/21 0251)   EMR reviewed none available    FINAL CLINICAL IMPRESSION(S) / ED DIAGNOSES   Final diagnoses:  Cat bite, initial encounter     Rx / DC Orders   ED Discharge Orders          Ordered    amoxicillin-clavulanate (AUGMENTIN) 875-125 MG  tablet  2 times daily        11/10/21 0252             Note:  This document was prepared using Dragon voice recognition software and may include unintentional dictation errors.   Please note:  Patient was evaluated in Emergency Department today for the symptoms described in the history of present illness. Patient was evaluated in the context of the global COVID-19 pandemic, which necessitated consideration that the patient might be at risk for infection with the SARS-CoV-2 virus that causes COVID-19. Institutional protocols and algorithms that pertain to the evaluation of patients at risk for COVID-19 are in a state of rapid change based on information released by regulatory bodies including the CDC and federal and state organizations. These policies and algorithms were followed during the patient's care in the ED.  Some ED evaluations and interventions may be delayed as a result of limited staffing during the pandemic.       Don Perking, [place], MD 11/10/21 [phone]

## 2021-11-10 NOTE — Discharge Instructions (Signed)
Wash the wound with warm water and soap.  Keep it dry and clean.  Take the antibiotics as prescribed.  You will need 3 more rabies shots that can be given to you at [facility] health urgent care on [address].  The phone number and address are attached to your discharge instructions.  Watch for signs of infection which include redness, pus, warmth of the skin, or fever.  If these develop return to the hospital.  Please go to urgent care on the following days for the rabies shot:  6/23 6/27 7/4

## 2021-11-13 ENCOUNTER — Ambulatory Visit
Admission: EM | Admit: 2021-11-13 | Discharge: 2021-11-13 | Disposition: A | Discharge status: Home / Self Care | Payer: BC Managed Care – PPO | Attending: Emergency Medicine | Admitting: Emergency Medicine

## 2021-11-13 DIAGNOSIS — Z203 Contact with and (suspected) exposure to rabies: Secondary | ICD-10-CM | POA: Diagnosis not present

## 2021-11-13 MED ORDER — RABIES VACCINE, PCEC IM SUSR
1.0000 mL | Freq: Once | INTRAMUSCULAR | Status: AC
  Administered 2021-11-13: 1 mL via INTRAMUSCULAR

## 2021-11-17 ENCOUNTER — Ambulatory Visit
Admission: EM | Admit: 2021-11-17 | Discharge: 2021-11-17 | Disposition: A | Discharge status: Home / Self Care | Payer: BC Managed Care – PPO | Attending: Emergency Medicine | Admitting: Emergency Medicine

## 2021-11-17 DIAGNOSIS — Z203 Contact with and (suspected) exposure to rabies: Secondary | ICD-10-CM

## 2021-11-17 MED ORDER — RABIES VACCINE, PCEC IM SUSR
1.0000 mL | Freq: Once | INTRAMUSCULAR | Status: AC
  Administered 2021-11-17: 1 mL via INTRAMUSCULAR

## 2021-11-17 NOTE — ED Triage Notes (Signed)
Patient presents to [facility] for rabies vaccine day 7. He states he has tolerated previous doses well. Dose 3 administered; tolerated well. Instructed to return day 14. Voiced understanding.

## 2021-11-24 ENCOUNTER — Ambulatory Visit
Admission: EM | Admit: 2021-11-24 | Discharge: 2021-11-24 | Disposition: A | Discharge status: Home / Self Care | Payer: BC Managed Care – PPO | Attending: Internal Medicine | Admitting: Internal Medicine

## 2021-11-24 DIAGNOSIS — Z203 Contact with and (suspected) exposure to rabies: Secondary | ICD-10-CM | POA: Diagnosis not present

## 2021-11-24 MED ORDER — RABIES VACCINE, PCEC IM SUSR
1.0000 mL | Freq: Once | INTRAMUSCULAR | Status: AC
  Administered 2021-11-24: 1 mL via INTRAMUSCULAR

## 2021-11-24 NOTE — ED Triage Notes (Signed)
Patient presents to [facility] for his last rabies infection.

## 2024-04-16 ENCOUNTER — Other Ambulatory Visit: Payer: Self-pay | Admitting: Internal Medicine

## 2024-04-16 DIAGNOSIS — I499 Cardiac arrhythmia, unspecified: Secondary | ICD-10-CM

## 2024-04-16 DIAGNOSIS — G4733 Obstructive sleep apnea (adult) (pediatric): Secondary | ICD-10-CM

## 2024-04-24 ENCOUNTER — Ambulatory Visit
Admission: RE | Admit: 2024-04-24 | Discharge: 2024-04-24 | Disposition: A | Discharge status: Home / Self Care | Payer: Self-pay | Source: Ambulatory Visit | Attending: Internal Medicine | Admitting: Internal Medicine

## 2024-04-24 DIAGNOSIS — G4733 Obstructive sleep apnea (adult) (pediatric): Secondary | ICD-10-CM | POA: Insufficient documentation

## 2024-04-24 DIAGNOSIS — I499 Cardiac arrhythmia, unspecified: Secondary | ICD-10-CM | POA: Insufficient documentation
